# Patient Record
Sex: Male | Born: 1945 | Race: White | Hispanic: No | Marital: Married | Smoking: Former smoker
Health system: Southern US, Community
[De-identification: ages and names within clinical notes are randomized; demographics above are authoritative.]

## PROBLEM LIST (undated history)

## (undated) DIAGNOSIS — I509 Heart failure, unspecified: Secondary | ICD-10-CM

## (undated) DIAGNOSIS — R0600 Dyspnea, unspecified: Secondary | ICD-10-CM

## (undated) DIAGNOSIS — I639 Cerebral infarction, unspecified: Secondary | ICD-10-CM

## (undated) DIAGNOSIS — R0601 Orthopnea: Secondary | ICD-10-CM

## (undated) DIAGNOSIS — I2510 Atherosclerotic heart disease of native coronary artery without angina pectoris: Secondary | ICD-10-CM

## (undated) DIAGNOSIS — R05 Cough: Secondary | ICD-10-CM

## (undated) DIAGNOSIS — J302 Other seasonal allergic rhinitis: Secondary | ICD-10-CM

## (undated) DIAGNOSIS — C439 Malignant melanoma of skin, unspecified: Secondary | ICD-10-CM

## (undated) DIAGNOSIS — J449 Chronic obstructive pulmonary disease, unspecified: Secondary | ICD-10-CM

## (undated) DIAGNOSIS — E7800 Pure hypercholesterolemia, unspecified: Secondary | ICD-10-CM

## (undated) DIAGNOSIS — R053 Chronic cough: Secondary | ICD-10-CM

## (undated) DIAGNOSIS — I10 Essential (primary) hypertension: Secondary | ICD-10-CM

## (undated) DIAGNOSIS — J45909 Unspecified asthma, uncomplicated: Secondary | ICD-10-CM

## (undated) HISTORY — PX: CORONARY ARTERY BYPASS GRAFT: SHX141

## (undated) HISTORY — PX: UVULECTOMY: SHX2631

## (undated) HISTORY — PX: NASAL SINUS SURGERY: SHX719

## (undated) HISTORY — PX: CARDIAC SURGERY: SHX584

---

## 2005-04-12 ENCOUNTER — Inpatient Hospital Stay (HOSPITAL_COMMUNITY): Admission: EM | Admit: 2005-04-12 | Discharge: 2005-04-14 | Payer: Self-pay | Admitting: Podiatry

## 2005-04-12 ENCOUNTER — Emergency Department: Payer: Self-pay | Admitting: Emergency Medicine

## 2005-05-23 ENCOUNTER — Encounter: Admission: RE | Admit: 2005-05-23 | Discharge: 2005-05-23 | Payer: Self-pay | Admitting: Neurology

## 2005-08-16 ENCOUNTER — Other Ambulatory Visit: Payer: Self-pay

## 2005-08-16 ENCOUNTER — Inpatient Hospital Stay: Payer: Self-pay | Admitting: Internal Medicine

## 2005-08-17 ENCOUNTER — Other Ambulatory Visit: Payer: Self-pay

## 2005-08-18 ENCOUNTER — Other Ambulatory Visit: Payer: Self-pay

## 2005-08-20 ENCOUNTER — Inpatient Hospital Stay (HOSPITAL_COMMUNITY)
Admission: AD | Admit: 2005-08-20 | Discharge: 2005-08-25 | Payer: Self-pay | Admitting: Thoracic Surgery (Cardiothoracic Vascular Surgery)

## 2005-08-31 ENCOUNTER — Other Ambulatory Visit: Payer: Self-pay

## 2005-08-31 ENCOUNTER — Emergency Department: Payer: Self-pay | Admitting: Emergency Medicine

## 2005-09-19 ENCOUNTER — Encounter
Admission: RE | Admit: 2005-09-19 | Discharge: 2005-09-19 | Payer: Self-pay | Admitting: Thoracic Surgery (Cardiothoracic Vascular Surgery)

## 2005-10-03 ENCOUNTER — Encounter
Admission: RE | Admit: 2005-10-03 | Discharge: 2005-10-03 | Payer: Self-pay | Admitting: Thoracic Surgery (Cardiothoracic Vascular Surgery)

## 2005-10-16 ENCOUNTER — Encounter: Payer: Self-pay | Admitting: Internal Medicine

## 2005-10-30 ENCOUNTER — Encounter: Payer: Self-pay | Admitting: Internal Medicine

## 2005-11-01 ENCOUNTER — Ambulatory Visit: Payer: Self-pay | Admitting: Otolaryngology

## 2005-11-30 ENCOUNTER — Encounter: Payer: Self-pay | Admitting: Internal Medicine

## 2005-12-30 ENCOUNTER — Encounter: Payer: Self-pay | Admitting: Internal Medicine

## 2006-09-21 IMAGING — CT CT MAXILLOFACIAL WITHOUT CONTRAST
2 of 4 series · 15 of 30 positions shown, 19 images · non-contrast
Comparison: none

REASON FOR EXAM: post nasal drip chronic sinusitis  Tilegenov
COMMENTS:

[Series 4: (person_name) series (id) · axial · 0.33mm/px · z∈[+397,+534]mm · 13 of 161 slices shown, 17 images]
[im 12/161  brain]
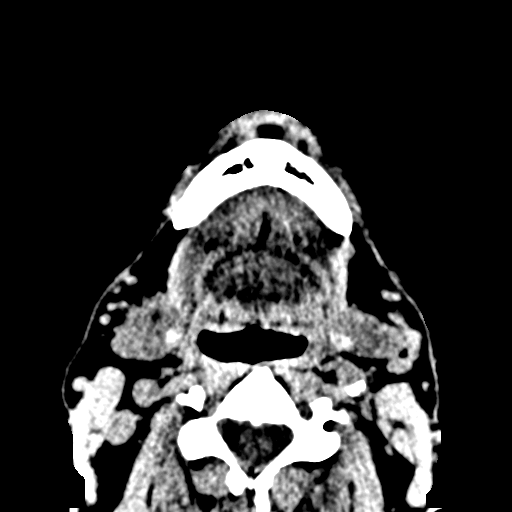
[im 12/161  bone]
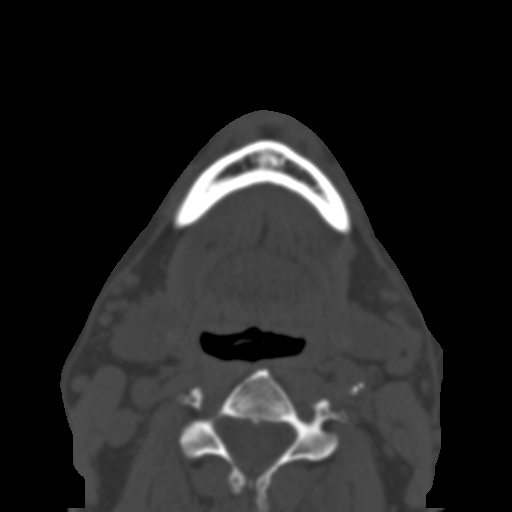
[im 23/161  bone]
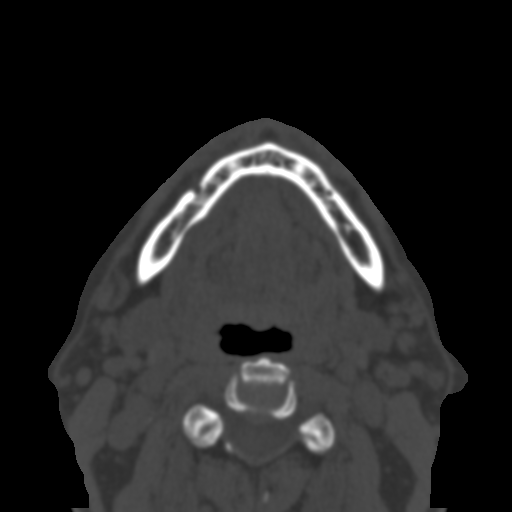
[im 35/161  bone]
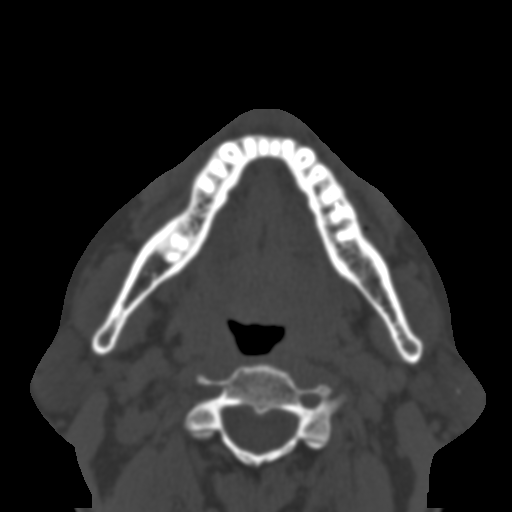
[im 46/161  bone]
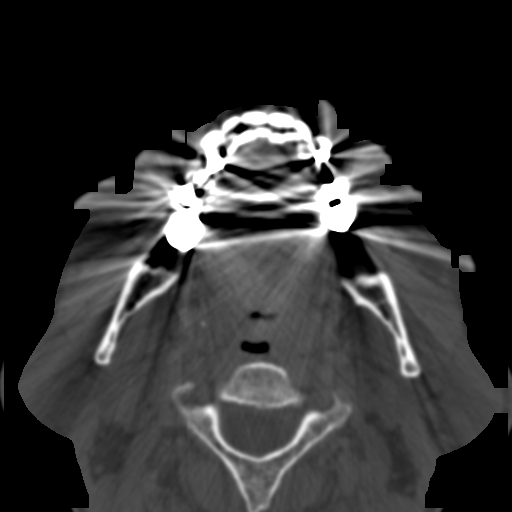
[im 58/161  brain]
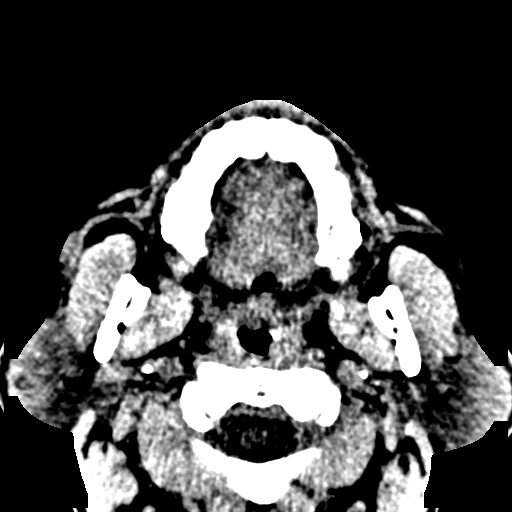
[im 58/161  bone]
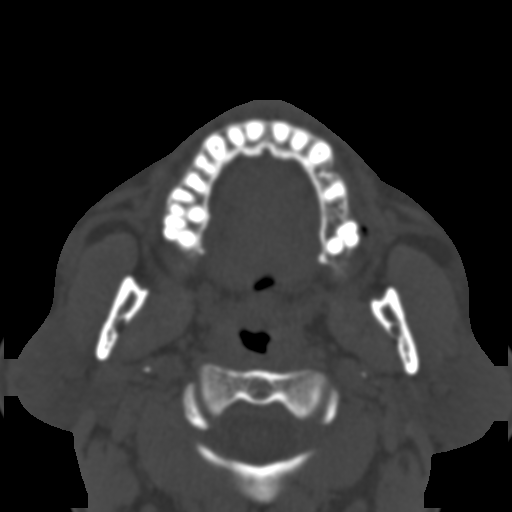
[im 69/161  bone]
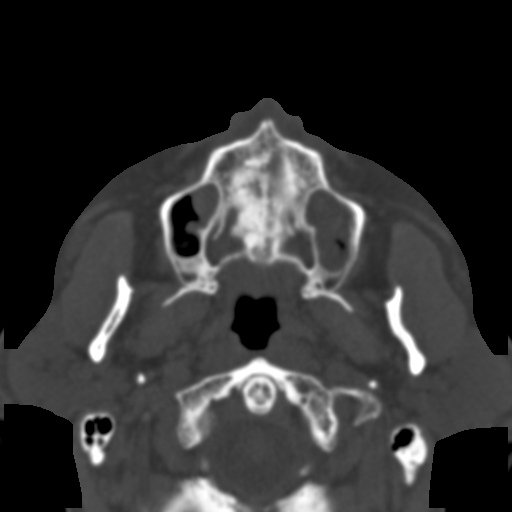
[im 81/161  bone]
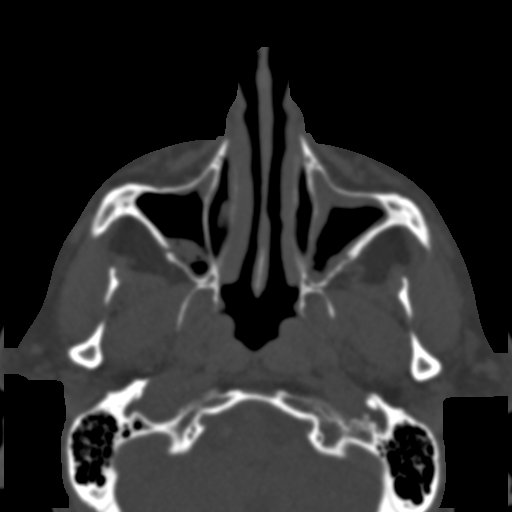
[im 92/161  bone]
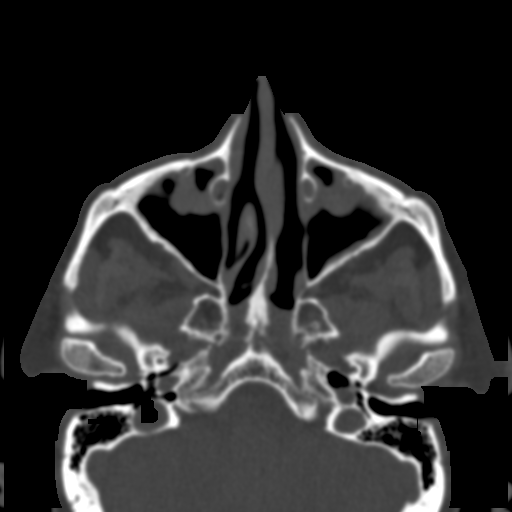
[im 103/161  brain]
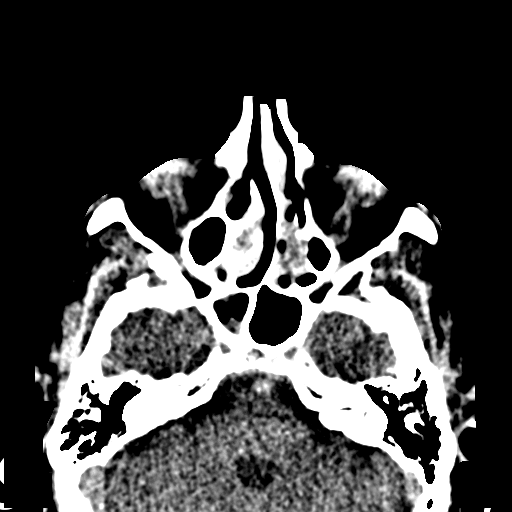
[im 103/161  bone]
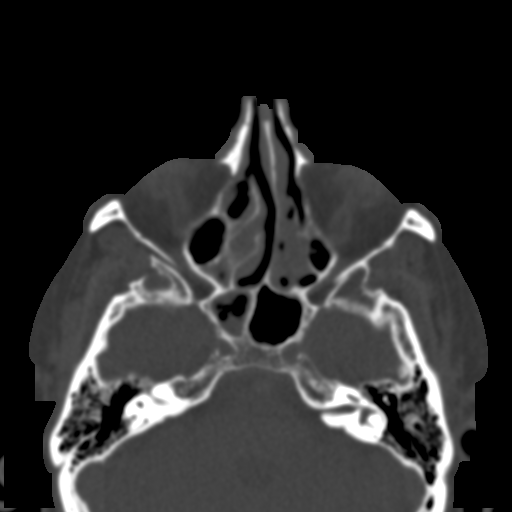
[im 115/161  bone]
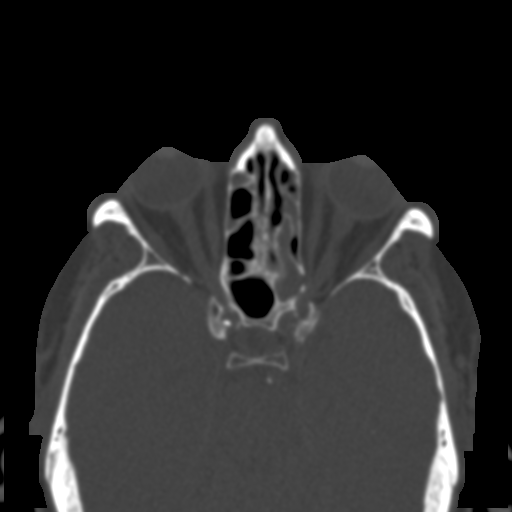
[im 126/161  bone]
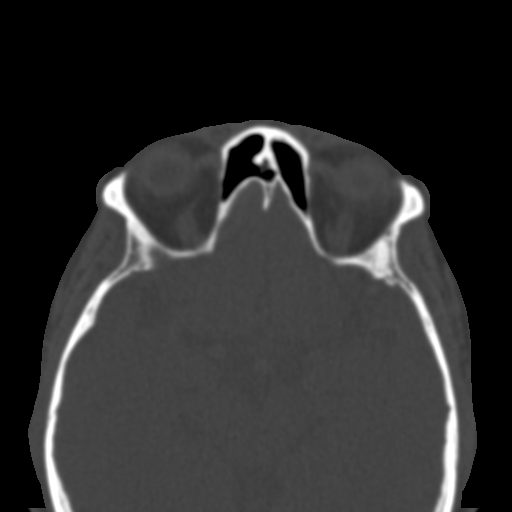
[im 138/161  bone]
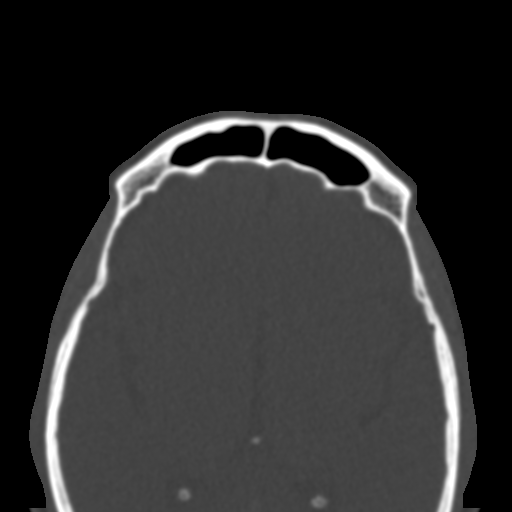
[im 149/161  brain]
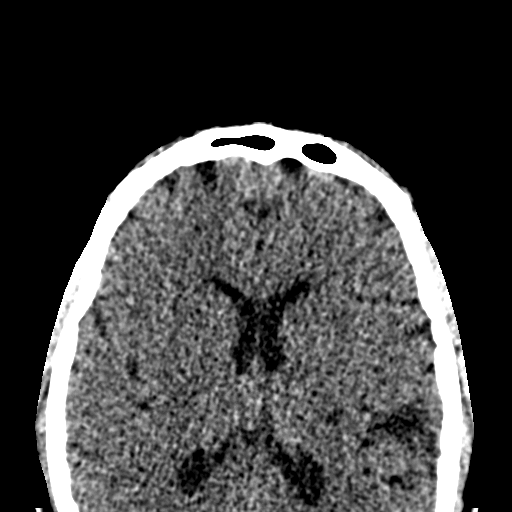
[im 149/161  bone]
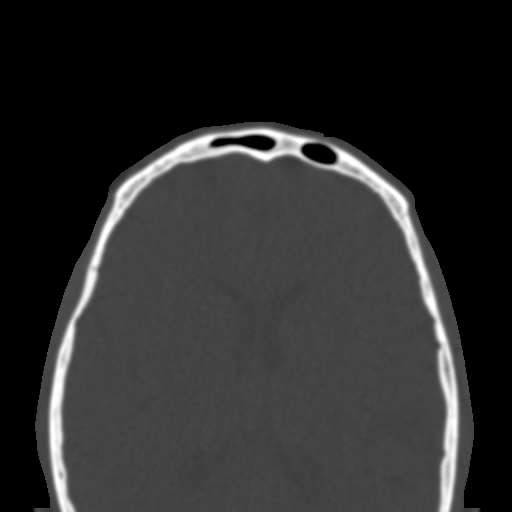

[Series 5: facial coronal · axial · 0.35mm/px · z∈[+428,+467]mm · 2 of 54 slices shown]
[im 14/54  bone]
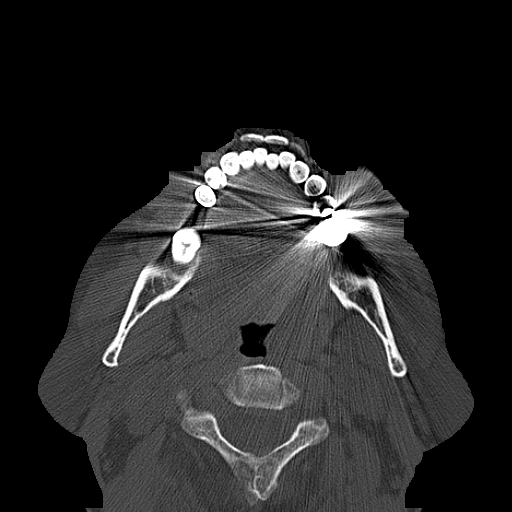
[im 27/54  bone]
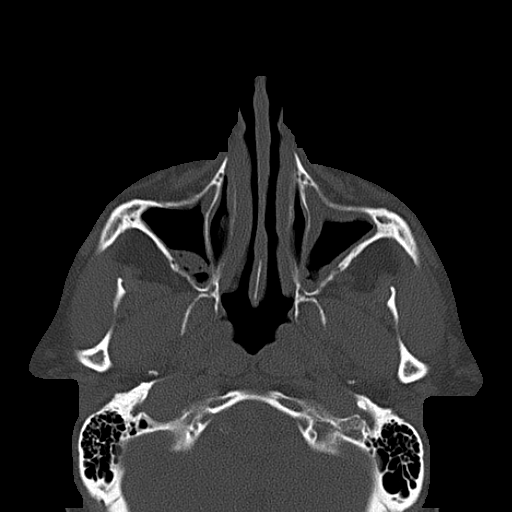

[15 of 30 positions shown; findings below may reference images not displayed]

PROCEDURE:     CT  - CT MAXILLOFACIAL ([facility])  - November 01, 2005  [DATE]

RESULT:     Supine Tilegenov series imaging through the paranasal sinuses is
performed.  The study shows slight curvature of the nasal septum to the
LEFT.  There is significant mucosal thickening predominantly in the
maxillary and ethmoid sinuses, but with some involvement in the RIGHT
sphenoid.  There is no evidence of bony destruction or significant
hypertrophy.  The findings can be consistent with extensive polyposis and/or
chronic sinusitis. No discrete air fluid levels are seen. The frontal
sinuses appear to be clear. It appears there may be changes of previous
surgery along the medial aspect of the maxillary sinuses bilaterally.  No
coronal reconstructions were performed. The mastoid air cells appear
normally aerated.
IMPRESSION: 1)See above.

## 2010-08-17 NOTE — Op Note (Signed)
NAMEPOWELL, HALBERT              ACCOUNT NO.:  [id number]   MEDICAL RECORD NO.:  [id number]          PATIENT TYPE:  INP   LOCATION:  2311                         FACILITY:  [facility]   PHYSICIAN:  Salvatore Decent. 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   OPERATION PERFORMED:  Median sternotomy, extracorporeal circulation,  coronary artery bypass grafting times five (left internal mammary artery to  left anterior descending, saphenous vein graft to first diagonal, saphenous  vein graft to posterior descending, sequential radial artery graft to ramus  intermedius and obtuse marginal 1), endoscopic vein harvest from right  thigh.   SURGEON:  Salvatore Decent. Dorris Fetch, M.D.   ASSISTANT:  Coral Ceo, P.A.   ANESTHESIA:  General.   FINDINGS:  All targets intramyocardial, relatively small for patient's size  but good quality conduits.  Normal left ventricular size and function.   INDICATIONS FOR PROCEDURE:  Mr. Zachary [name] is a 65 year old gentleman who has  a three to four week history of chest pain with exertion.  Over the past two  weeks it has worsened in frequency and severity and he has also had episodes  at rest.  He was admitted with unstable angina.  He ruled out for myocardial  infarction but at catheterization he had severe three-vessel coronary  disease and was referred for coronary artery bypass grafting.  The  indications, risks, benefits and alternatives were discussed in detail with  the patient and he understood and accepted the risks and agreed to proceed.  We did discuss potential use of the radial artery, given his young age.  The  patient understood the separate risks and benefits of that portion  of the  procedure as well and agreed to proceed.   DESCRIPTION OF PROCEDURE:  Mr. Pfiffner was brought to the preop holding  area on Aug 21, 2005.  There intravenous antibiotics were administered.  The  anesthesia service placed lines to monitor arterial, central venous and  pulmonary arterial pressure.  The patient was taken to the operating room,  anesthetized and intubated.  A Foley catheter was placed.  The chest,  abdomen, legs and the left arm were prepped and draped in the usual fashion.   Incision was made over the medial aspect of the right leg at the level of  the knee.  The greater saphenous vein was identified and was harvested from  the right thigh endoscopically.  It was an excellent quality vessel.  Simultaneously an incision was made over the volar aspect of the right  forearm overlying the radial pulse.  It was carried through the skin and  subcutaneous tissue.  The sheath overlying the radial artery was incised.  A  short segment of the radial artery was mobilized using the Harmonic scalpel  with proximal occlusion.  There was no change in the pulse of the vessel  distally.  This  confirmed the results of the preoperative Allen's test done  both the night before and in the preop holding.  The incision then was  extended to just below the antecubital fossa and the radial artery was  harvested using the Harmonic scalpel.  It was a good quality vessel.  5000  units of heparin was administered during the harvest of the conduits.  The  radial artery was divided distally.  There was excellent backbleeding from  the distal stump.  This was suture ligated with a 4-0 Prolene suture.  After  ensuring that all side branches had been clipped, this was divided  proximally as well and the proximal stump was also suture ligated.  The arm  and leg incisions were closed in standard fashion.   The median sternotomy was performed.  The left internal mammary artery was  harvested using  standard technique.  It was likewise a good quality vessel.  Remainder of the full heparin dose was given prior to dividing the distal  end of the left mammary artery.   The pericardium was opened.  The ascending aorta was inspected.  There was  no evidence of atherosclerotic disease.  The aorta was cannulated via  concentric 2-0 Ethibond pledgeted pursestring sutures.  A dual stage venous  cannula was placed via pursestring suture in the right atrial appendage.  Cardiopulmonary bypass was instituted and the patient was cooled to 32  degrees Celsius.  The coronary arteries were inspected and anastomotic sites  were chosen.  The conduits were inspected and cut to length.  A foam pad was  placed in the pericardium to protect the left phrenic nerve.  A temperature  probe was placed in the myocardial septum and a cardioplegia cannula was  placed in the ascending aorta.   The aorta was crossclamped.  The left ventricle was emptied via the aortic  root vent.  Cardiac arrest then was achieved with a combination of cold  antegrade blood cardioplegia and topical iced saline.  After achieving a  complete diastolic arrest and adequate myocardial septal cooling, the  following distal anastomoses were performed.   First, a reversed saphenous vein graft was placed end-to-side to the first  diagonal branch of the LAD.  This was a 1.5 mm intramyocardial good quality  target.  The vein was good quality, it was anastomosed end-to-side with a  running 7-0 Prolene suture.  At the completion of each anastomosis probes  were placed proximally and distally to ensure patency.  Cardioplegia was  administered down the vein graft.  There was good hemostasis and good flow.   Next a reversed saphenous vein graft was placed end-to-side to the posterior  descending branch of the right coronary artery.  The right coronary artery was totally occluded.  The posterior descending was a 1.3 mm fair quality  target.  The  vein was of good quality.  It was anastomosed end-to-side with  a running 7-0 Prolene suture.  The distal posterolateral branches of the  right coronary were too small to grasp.   Next, the left radial artery was placed sequentially to the ramus  intermedius and obtuse marginal 1 branch of the left circumflex.  These were  both 1.5 mm vessels and both intramyocardial.  Side-to-side anastomosis was  performed to the ramus intermedius and end-to-side to obtuse marginal 1.  Both were done with running 8-0 Prolene sutures.  There was excellent flow  through the graft.  Cardioplegia was administered down the vein grafts and  via  the aortic root.  There was good back bleeding from the radial artery.   Next, the left internal mammary artery was brought through a window in the  pericardium.  The distal end was spatulated.  It was anastomosed end-to-side  to the LAD.  The LAD was a 2 mm intramyocardial vessel.  The vessel did have  some plaque distal to the anastomosis but a probe did pass easily to the  apex. The mammary was of good quality.  The anastomosis was performed with a  running 8-0 Prolene suture.  At the completion of the mammary to LAD  anastomosis, the bulldog clamp was briefly removed and inspected for  hemostasis.  Immediate and rapid septal rewarming was noted.  The bulldog  clamps were placed.  The mammary pedicle was clamped to the epicardial  surface of the heart with 6-0 Prolene sutures.   Additional cardioplegia then was administered.  The radial artery was of  good size and adequate size and adequate size to anastomose directly to a  4.0 mm punch aortotomy.  The vein grafts were anastomosed to 4.5 mm punch  aortotomies.  At completion of the final proximal anastomosis, the patient  was placed in Trendelenburg position.  Lidocaine was administered.  The  bulldog clamp was again removed from the left mammary artery.  The aortic  root was deaired and the aortic crossclamp was  removed.  The total  crossclamp time was 89 minutes.   While the patient was being rewarmed, all proximal and distal anastomoses  were inspected for hemostasis.  Epicardial pacing wires were placed on the  right ventricle and right atrium and when the patient had been rewarmed to a  core temperature of 37 degrees Celsius, he was weaned from cardiopulmonary  bypass without difficulty in sinus rhythm and on no inotropic support.  The  total bypass time was 132 minutes.   The initial cardiac index was greater than 2L per minute per minute squared  and the patient remained hemodynamically stable throughout the post bypass  period.  The test dose of protamine was administered and was well tolerated.  The atrial and aortic cannulae were removed.  The remainder of the protamine  was administered without incident.  The chest was irrigated with 1L of warm normal saline containing 1 gm of vancomycin.  Hemostasis was achieved.  A  left pleural and two mediastinal chest tubes were placed through separate  subcostal incisions.  The pericardium was reapproximated with interrupted 3-  0 silk sutures.  It came together easily without tension.  The sternum was  closed with heavy gauge interrupted stainless steel wires.  The subcutaneous  tissue and pectoralis fascia was closed in standard fashion.  All sponge,  needle and instrument counts were correct at the end of the procedure.  The  patient was taken from the operating room to the surgical intensive care  unit in critical but stable condition.           ______________________________  Salvatore Decent Dorris Fetch, M.D.     SCH/MEDQ  D:  08/21/2005  T:  08/22/2005  Job:  [id number]   cc:   Dr. Arnoldo Hooker   Dr. Lind Guest   Dr. Annetta Maw, M.D.  Fax: [phone]

## 2010-08-17 NOTE — H&P (Signed)
NAMEADVITH, MARTINE              ACCOUNT NO.:  [id number]   MEDICAL RECORD NO.:  [id number]          PATIENT TYPE:  INP   LOCATION:  2005                         FACILITY:  [facility]   PHYSICIAN:  Salvatore Decent. Dorris Fetch, M.D.DATE OF BIRTH:  1945/11/04   DATE OF ADMISSION:  08/20/2005  DATE OF DISCHARGE:                                HISTORY & PHYSICAL   CHIEF COMPLAINT:  Chest pain.   HISTORY OF PRESENT ILLNESS:  The patient is a 65 year old white male who was  admitted in transfer today from [facility] with a  diagnosis of severe three-vessel coronary artery disease with unstable  angina.  The patient had complained of a 3- to 4-week history of chest pain  which occurred with exertion and was relieved with rest.  He states that  over the past 2 weeks his symptoms have worsened in frequency and severity.  He has had some associated shortness of breath but had thought this was more  related to his history of sinus trouble.  He also has experienced one  episode of orthopnea but denies any nausea, vomiting, diaphoresis or  shortness of breath associated with the chest pain episodes.  He was seen by  his primary care physician and was undergoing a workup initially for  pulmonary problems including pulmonary function tests and allergy studies;  however, he ultimately underwent outpatient stress echocardiogram.  During  that study he was only able to walk for approximately 5 minutes before  becoming symptomatic.  Because of this he was originally scheduled for  outpatient cardiac catheterization today with Dr. Gwen Pounds; however, a  significant episode of chest pain at rest prompted him to present to the  emergency department at [facility] on Friday Aug 16, 2005, for further  evaluation.  This was the first episode he has had at rest and was  significantly worse in intensity than any of his previous episodes.  He is  admitted for further evaluation and was subsequently  ruled out for  myocardial infarction.  He underwent cardiac catheterization on Aug 19, 2005, and was found to have severe three-vessel coronary artery disease.  He  was found to have a 70% stenosis of the distal left main, had 90% proximal  LAD stenosis, an 80% stenosis of the first diagonal, an 80% stenosis of the  first obtuse marginal, and 100% stenosis of proximal right coronary artery,  and left ventricular ejection fraction of around 60%.  He has had several  episodes of chest pain while in the hospital and he feels that all of those  have been related to getting out of bed and ambulating in the room, as well  as removing his supplemental oxygen.  These episodes have been quickly  relieved with rest and restart of his O2, and he has been stable over the  past 24 hours.  Because of his severe three-vessel disease and his unstable  symptoms, he was transferred to [facility] today for a  cardiothoracic surgery consultation and consideration of coronary artery  bypass grafting by Dr. Dorris Fetch.   Of note, the patient has also  experienced pain occurring in his back  intermittently for the past 3-4 weeks with minimal activity, and on the date  of his admission to [facility] he had a significant episode of pain in his  back as well as pain in his chest at rest.   PAST MEDICAL HISTORY:  1.  Hypertension.  2.  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  3.  Malignant melanoma.  4.  Hyperlipidemia.  5.  Sleep apnea (patient has CPAP at home but does not use it).  6.  History of chronic sinusitis.   PAST SURGICAL HISTORY:  1.  Excision of melanoma on his back around 20 years ago.  2.  Sinus surgery.   MEDICATIONS ON ADMISSION:  1.  Aspirin 81 mg daily.  2.  Nasonex two sprays daily.  3.  Nitroglycerin 2% ointment q.8h.  4.  Lopressor 25 mg q.8h.   HOME MEDICATIONS:  1.  Lisinopril 5 mg daily.  2.  Metoprolol ER 25 mg daily.  3.  Nasonex two puffs  daily.   ALLERGIES:  He reports allergies to HYDROCHLOROTHIAZIDE which caused itching  and a rash; also to LIPITOR which caused a rash.   SOCIAL HISTORY:  He is married and has two children.  He resides with his  wife in [place].  He previously smoked a pack of cigarettes per day for  approximately 35 years.  He quit smoking 8 years ago.  He does not consume  alcohol.  He is currently employed as a Engineer, water.   FAMILY HISTORY:  His mother died of pancreatic cancer.  His father has a  history of coronary artery disease and underwent a CABG 2 years ago.  He  also has hypertension and has had a CVA in the past.  Two of his sisters  have hypertension and he has one other sister who is alive and well with no  chronic medical problems.   REVIEW OF SYSTEMS:  See history of present illness for pertinent positives  and negatives.  Additionally, he is status post a hemorrhagic right-sided  CVA in January 2007 for which he was treated [facility].  According  to his wife, he continues to have some right-sided arm and leg weakness,  although the patient has not noted any particular problems in this area.  Otherwise, he is recovered well from his stroke.  He has a history of  chronic sinus problems with productive cough most days and postnasal  drainage.  He wears glasses.  He has history of sleep apnea as documented  above but does not use CPAP and is not currently being treated otherwise.  He denies fevers, chills, weight loss, recent infections, neurologic  symptoms, visual changes, dysphagia and dysarthria, syncope, presyncope,  heart palpitations, abdominal pain, nausea, vomiting, diarrhea,  constipation, reflux symptoms, hematochezia, melena, hematuria, dysuria,  nocturia, lower extremity edema, lower extremity claudication symptoms or  rest pain, muscle and joint pain or weakness, anxiety, depression or other  psychiatric illnesses.  PHYSICAL EXAMINATION:  VITAL SIGNS:  Blood  pressure is 120/70, heart rate 74  and regular, respirations 18 and unlabored, temperature 97.3, O2 saturation  98% on 2 L.  GENERAL:  This is a well-developed, well-nourished white male in no acute  distress.  HEENT:  Normocephalic, atraumatic.  Pupils equal, round and react to light  accommodation.  Extraocular movements intact.  Exam of the external ears and  nose reveal no abnormalities.  Oropharynx is clear with moist mucous  membranes  and good dentition.  NECK:  Supple without lymphadenopathy, thyromegaly or carotid bruits.  HEART:  Regular rate and rhythm without murmurs, rubs or gallops.  LUNGS:  Clear to auscultation.  ABDOMEN:  Soft, nontender, nondistended with active bowel sounds in all  quadrants.  No masses or hepatosplenomegaly.  EXTREMITIES:  No clubbing, cyanosis or edema.  He has a StarClose device in  the right groin and no evidence of hematoma.  He has 2+ femoral and  posterior tibial pulses and 1+ dorsalis pedis pulses bilaterally.  NEUROLOGIC:  Cranial nerves II-XII appear grossly intact.  He is alert and  oriented x3.  Gait is unable to be tested.   ASSESSMENT AND PLAN:  This is a 65 year old white male with severe three-  vessel coronary artery disease and unstable angina symptoms.  He is being  admitted to [facility] today under the care of Dr. Charlett Lango for consideration of surgical revascularization.  He will  undergo complete preoperative workup this evening and potentially could  undergo CABG on Aug 21, 2005.      Coral Ceo, P.A.    ______________________________  Salvatore Decent Dorris Fetch, M.D.    GC/MEDQ  D:  08/20/2005  T:  08/20/2005  Job:  [id number]   cc:   Marcial Pacas M.D. [facility]  [place], [place]   Dorothyann Peng, M.D.   Marlan Palau, M.D.  Fax: [phone]

## 2010-08-17 NOTE — Discharge Summary (Signed)
Zachary [name], Zachary [name]              ACCOUNT NO.:  [id number]   MEDICAL RECORD NO.:  [id number]          PATIENT TYPE:  INP   LOCATION:  2016                         FACILITY:  [facility]   PHYSICIAN:  Salvatore Decent. Dorris Fetch, M.D.DATE OF BIRTH:  12-26-1945   DATE OF ADMISSION:  08/20/2005  DATE OF DISCHARGE:  08/25/2005                                 DISCHARGE SUMMARY   ADMISSION DIAGNOSIS:  Chest pain.   HISTORY OF PRESENT ILLNESS AND DISCHARGE DIAGNOSES:  1.  Hypertension.  2.  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  3.  Malignant melanoma.  4.  Hyperlipidemia.  5.  Sleep apnea.  6.  History of chronic sinusitis.  7.  Excision of melanoma in his back approximately 20 years ago.  8.  Sinus surgery.  9.  Severe three-vessel coronary artery disease status post coronary artery      bypass grafting x5.  10. Postoperative acute blood loss anemia, currently being treated with iron      and folic acid supplementation.   ALLERGIES:  1.  HYDROCHLOROTHIAZIDE causes itching and rash.  2.  LIPITOR causes rash.   BRIEF HISTORY:  The patient is a 65 year old Caucasian male who was admitted  via transfer to [facility] from [facility] on Aug 20, 2005,  with diagnosis of severe three-vessel coronary artery disease with unstable  angina.  The patient had experienced 3 to 4-week history of chest pain with  exertion, that was relieved with rest, that had progressed in frequency and  severity.  He was seen initially by his primary care physician and had been  referred for outpatient stress echocardiogram.  During that study, he was  only able to walk for approximately 5 minutes before becoming symptomatic.  Secondary to this, the patient was originally scheduled for an outpatient  cardiac catheterization on Aug 20, 2005, with Dr. Gwen Pounds; however, he  experienced a significant episode of chest pain at rest which prompted him  to present to the  ER at [facility] on Friday, May 18, for further  evaluation.  He was admitted and ruled out for myocardial infarction.  He  underwent cardiac catheterization on Aug 19, 2005, and was found to have  severe three-vessel coronary artery disease.  Secondary to these findings,  the patient was transferred to [facility] under the care of Dr.  Charlett Lango for consideration of coronary artery bypass grafting.   HOSPITAL COURSE:  The patient was admitted on Aug 20, 2005, via transfer as  previously stated.  Dr. Dorris Fetch evaluated the patient, and it was his  opinion the patient should proceed with coronary revascularization.  The  patient was taken to the OR on Aug 21, 2005, for coronary artery bypass  grafting x5.  The left internal mammary artery was grafted to the LAD;  saphenous vein was grafted to the diagonal; saphenous vein was grafted to  the right coronary artery; and the left renal artery was grafted in sequence  to the ramus and OM-1. Saphenous vein graft harvesting was performed on the  right thigh.  The patient tolerated the procedure well and was  hemodynamically stable immediately postoperatively.  The patient was  transferred from the OR to the SICU in stable condition.  The patient was  extubated without complication and woke up from anesthesia neurologically  intact.   Secondary to patient's history of intracranial bleed, neurology service was  asked to evaluate the patient in the early postoperative course.  It was his  opinion that there was on contraindication to antiplatelet agents such as  aspirin.  On postoperative day #1, the patient was afebrile with stable  vital signs and maintaining normal sinus rhythm.  His invasive lines and  chest tubes were discontinued in a routine manner, and he was started on  p.o. amiodarone for his radial artery graft.  He was also started on  aspiring.   The patient began cardiac rehab on postoperative day #1 and  has increased  his tolerance to a satisfactory level at this time.  The patient has been  volume overloaded postoperatively and has been diuresed accordingly.   On postoperative day #3, the patient is afebrile with stable vital signs and  maintaining normal sinus rhythm.  On physical exam, cardiac are regular rate  and rhythm.  Lungs reveal crackles in the left base.  The abdomen is benign.  The incisions are clean, dry, and intact, and there is no edema present in  the bilateral lower extremities.  He was noted to be anemic with a  hemoglobin of 9.3 and a hematocrit of 29.6.  He will, therefore, be started  on iron and folic acid supplementation.   The patient is in stable condition at this time.  As long as he continues to  progress in the current manner, he should be ready for discharge within the  next 1 to 2 days pending morning round evaluation.   LABORATORY DATA:  CBC and BMP on Aug 24, 2005: White count 13.2, hemoglobin  9.3, hematocrit 26.9, platelets 139.  Sodium 138, potassium 4.2, BUN 10,  creatinine 1, glucose 111.   CONDITION ON DISCHARGE:  Improved.   DISCHARGE INSTRUCTIONS:   #1. DISCHARGE MEDICATIONS:  1.  Aspirin 325 mg daily.  2.  Lopressor 25 mg twice daily.  3.  Lisinopril 5 mg daily.  4.  Isosorbide mononitrate 30 mg daily.  5.  Nasonex 2 sprays daily.  6.  Iron supplementation 325 mg daily.  7.  Folic acid 1 mg daily.  8.  Oxycodone 5 mg 1 to 2 q. 4-6 h p.r.n. pain.   ACTIVITY:  No driving and no lifting more than 10 pounds x3 weeks, and the  patient should continue daily breathing and walking exercises.   DIET:  Low salt, low fat.   WOUND CARE:  The patient may shower daily and clean the incisions with soap  and water.  If any  problems arise, the patient should contact the CVTS  office at [phone].   FOLLOW UP:  1.  Appointment with Dr. Gwen Pounds.  The patient will be instructed to     contact his office for an appointment 2 weeks after  discharge.  2.  With Dr. Dorris Fetch on September 19, 2005, at 11:15 a.m.      [name], PA    ______________________________  Salvatore Decent Dorris Fetch, M.D.    AY/MEDQ  D:  08/24/2005  T:  08/25/2005  Job:  [id number]   cc:   Dr. Gwen Pounds

## 2010-08-17 NOTE — Consult Note (Signed)
Zachary [name], Zachary [name] NO.:  [id number]   MEDICAL RECORD NO.:  [id number]          PATIENT TYPE:  INP   LOCATION:  2311                         FACILITY:  [facility]   PHYSICIAN:  Marlan Palau, M.D.  DATE OF BIRTH:  [birth year]   DATE OF CONSULTATION:  08/21/2005  DATE OF DISCHARGE:                                   CONSULTATION   Zachary [name] is a 65 year old right-handed white male born January 29, 1946 with a history of cerebrovascular disease.  Patient was admitted to  [facility] on the neurology service on the 12th of January 2007  with a hemorrhagic infarct involving the left centrum semi ovale.  Patient  underwent a work-up at that time that reveals evidence of right middle  cerebral artery stenosis and some bilateral mild cavernous carotid artery  atherosclerotic changes.  Otherwise, MRI angiogram of the intracranial and  extracranial vessels were unremarkable.  Patient had minimal deficit at that  time and recovered quite well.  Patient has done well and has been admitted  at this point with problems with severe three vessel coronary artery disease  and three to four-week history of unstable angina.  Patient underwent a CABG  procedure today.  Dr. Dorris Fetch has asked neurology to comment on whether  patient may be able to go back on antiplatelet agents at this time.   PAST MEDICAL HISTORY:  1.  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  2.  Hypertension.  3.  Hypercholesterolemia.  4.  Sinus surgery in the past.  5.  History of sleep apnea on CPAP; does not use the machine.  6.  Mild obesity.  7.  Malignant melanoma resected 20 years ago.   Patient had been on aspirin 81 mg, Nasonex two sprays a day, nitroglycerin  ointment q.8h., Lopressor 25 mg q.8h. prior to admission.  Patient has an  allergy to HYDROCHLOROTHIAZIDE, LIPITOR.  Patient does not smoke or drink.   SOCIAL HISTORY:  [place].  Has two children alive and well.  Lives with his wife.  Works as an  Art gallery manager.   FAMILY HISTORY:  Notable that mother died with cancer.  Father is alive with  hypertension, stroke.  Patient has three sisters, one with hypertension.  No  history of diabetes or heart disease in the family.   REVIEW OF SYSTEMS:  Unobtainable at this point.  Patient is intubated.   PHYSICAL EXAMINATION:  VITALS:  Blood pressure currently is 100/70, heart  rate 84, temperature afebrile, respiratory rate 12 per ventilator.  HEENT:  Head is atraumatic.  Pupils are 1-2 mm, trace reactive.  Disks are  not evaluated.  NECK:  Supple.  No carotid bruits noted.  RESPIRATORY:  Clear.  CARDIOVASCULAR:  Distant heart sounds.  No obvious murmurs, rubs noted.  EXTREMITIES:  Right leg is bandaged.  Left leg is not.  No significant edema  noted.  Patient will nod to questions, will follow commands, squeeze both  hands, wiggle toes.  An extensive cerebellar testing procedure and  neurologic examination otherwise  was not done.  Deep tendon reflexes are  symmetric.  Toes are neutral bilaterally.   LABORATORY VALUES:  Notable for white count of 20.6, hemoglobin of 11.9,  hematocrit of 34.8, MCV of 90.8, platelets of 134.  INR of 1.3.  Sodium 138,  potassium 4.7, glucose of 120, CO2 of 26, glucose of 114, BUN of 10,  creatinine 1.1, calcium 9.  Urinalysis revealed specific gravity 1.018, pH  of 6.5.   IMPRESSION:  1.  History of severe three vessel coronary artery disease status post      coronary artery bypass graft procedure.  2.  History of hemorrhagic infarct left centrum semi ovale in January 2007.   This patient at this point is well out from the hemorrhagic infarct and  clearly is a candidate for antiplatelet agents.  Should be no significant  increase in risk of recurrent hemorrhage using low-dose aspirin at this  point from a neurologic standpoint.  Will be happy to see this patient  at  any point again if needed.  Patient denies any further episodes of numbness,  weakness, speech disturbance.      Marlan Palau, M.D.  Electronically Signed     CKW/MEDQ  D:  08/21/2005  T:  08/22/2005  Job:  [id number]

## 2014-03-05 ENCOUNTER — Ambulatory Visit: Payer: Self-pay | Admitting: Internal Medicine

## 2015-05-05 ENCOUNTER — Emergency Department: Payer: Medicare Other

## 2015-05-05 ENCOUNTER — Encounter: Payer: Self-pay | Admitting: Emergency Medicine

## 2015-05-05 ENCOUNTER — Inpatient Hospital Stay
Admission: EM | Admit: 2015-05-05 | Discharge: 2015-05-07 | DRG: 190 | Disposition: A | Discharge status: Home / Self Care | Payer: Medicare Other | Attending: Internal Medicine | Admitting: Internal Medicine

## 2015-05-05 DIAGNOSIS — R000 Tachycardia, unspecified: Secondary | ICD-10-CM | POA: Diagnosis present

## 2015-05-05 DIAGNOSIS — R739 Hyperglycemia, unspecified: Secondary | ICD-10-CM | POA: Diagnosis present

## 2015-05-05 DIAGNOSIS — Z7982 Long term (current) use of aspirin: Secondary | ICD-10-CM

## 2015-05-05 DIAGNOSIS — Z800 Family history of malignant neoplasm of digestive organs: Secondary | ICD-10-CM

## 2015-05-05 DIAGNOSIS — Z951 Presence of aortocoronary bypass graft: Secondary | ICD-10-CM

## 2015-05-05 DIAGNOSIS — J9601 Acute respiratory failure with hypoxia: Secondary | ICD-10-CM | POA: Diagnosis present

## 2015-05-05 DIAGNOSIS — Z8249 Family history of ischemic heart disease and other diseases of the circulatory system: Secondary | ICD-10-CM

## 2015-05-05 DIAGNOSIS — R0902 Hypoxemia: Secondary | ICD-10-CM

## 2015-05-05 DIAGNOSIS — I2510 Atherosclerotic heart disease of native coronary artery without angina pectoris: Secondary | ICD-10-CM | POA: Diagnosis present

## 2015-05-05 DIAGNOSIS — Z87891 Personal history of nicotine dependence: Secondary | ICD-10-CM | POA: Diagnosis not present

## 2015-05-05 DIAGNOSIS — J441 Chronic obstructive pulmonary disease with (acute) exacerbation: Secondary | ICD-10-CM | POA: Diagnosis not present

## 2015-05-05 DIAGNOSIS — E785 Hyperlipidemia, unspecified: Secondary | ICD-10-CM | POA: Diagnosis present

## 2015-05-05 DIAGNOSIS — D72829 Elevated white blood cell count, unspecified: Secondary | ICD-10-CM | POA: Diagnosis present

## 2015-05-05 DIAGNOSIS — I10 Essential (primary) hypertension: Secondary | ICD-10-CM | POA: Diagnosis present

## 2015-05-05 DIAGNOSIS — J209 Acute bronchitis, unspecified: Secondary | ICD-10-CM | POA: Diagnosis present

## 2015-05-05 DIAGNOSIS — Z8673 Personal history of transient ischemic attack (TIA), and cerebral infarction without residual deficits: Secondary | ICD-10-CM | POA: Diagnosis not present

## 2015-05-05 DIAGNOSIS — R079 Chest pain, unspecified: Secondary | ICD-10-CM

## 2015-05-05 HISTORY — DX: Heart failure, unspecified: I50.9

## 2015-05-05 HISTORY — DX: Essential (primary) hypertension: I10

## 2015-05-05 HISTORY — DX: Atherosclerotic heart disease of native coronary artery without angina pectoris: I25.10

## 2015-05-05 HISTORY — DX: Cerebral infarction, unspecified: I63.9

## 2015-05-05 HISTORY — DX: Pure hypercholesterolemia, unspecified: E78.00

## 2015-05-05 HISTORY — DX: Chronic obstructive pulmonary disease, unspecified: J44.9

## 2015-05-05 LAB — BASIC METABOLIC PANEL
ANION GAP: 13 (ref 5–15)
BUN: 14 mg/dL (ref 6–20)
CALCIUM: 9.4 mg/dL (ref 8.9–10.3)
CHLORIDE: 103 mmol/L (ref 101–111)
CO2: 21 mmol/L — ABNORMAL LOW (ref 22–32)
CREATININE: 1.05 mg/dL (ref 0.61–1.24)
GFR calc non Af Amer: >60 mL/min (ref >60)
Glucose, Bld: 161 mg/dL — ABNORMAL HIGH (ref 65–99)
Potassium: 3.8 mmol/L (ref 3.5–5.1)
SODIUM: 137 mmol/L (ref 135–145)

## 2015-05-05 LAB — CBC
HCT: 48.4 % (ref 40.0–52.0)
Hemoglobin: 16.4 g/dL (ref 13.0–18.0)
MCH: 30.8 pg (ref 26.0–34.0)
MCHC: 33.8 g/dL (ref 32.0–36.0)
MCV: 90.9 fL (ref 80.0–100.0)
PLATELETS: 175 10*3/uL (ref 150–440)
RBC: 5.32 MIL/uL (ref 4.40–5.90)
RDW: 13.7 % (ref 11.5–14.5)
WBC: 14.2 10*3/uL — AB (ref 3.8–10.6)

## 2015-05-05 LAB — TROPONIN I

## 2015-05-05 LAB — MAGNESIUM: Magnesium: 1.7 mg/dL (ref 1.7–2.4)

## 2015-05-05 MED ORDER — METOPROLOL TARTRATE 50 MG PO TABS
50.0000 mg | ORAL_TABLET | Freq: Two times a day (BID) | ORAL | Status: DC
  Administered 2015-05-05 – 2015-05-07 (×4): 50 mg via ORAL
  Filled 2015-05-05 (×3): qty 1

## 2015-05-05 MED ORDER — HEPARIN SODIUM (PORCINE) 5000 UNIT/ML IJ SOLN
5000.0000 [IU] | Freq: Three times a day (TID) | INTRAMUSCULAR | Status: DC
  Administered 2015-05-05 – 2015-05-07 (×5): 5000 [IU] via SUBCUTANEOUS
  Filled 2015-05-05 (×5): qty 1

## 2015-05-05 MED ORDER — ISOSORBIDE MONONITRATE ER 30 MG PO TB24
30.0000 mg | ORAL_TABLET | Freq: Every day | ORAL | Status: DC
  Administered 2015-05-05 – 2015-05-06 (×2): 30 mg via ORAL
  Filled 2015-05-05 (×2): qty 1

## 2015-05-05 MED ORDER — DEXTROMETHORPHAN POLISTIREX ER 30 MG/5ML PO SUER
30.0000 mg | Freq: Two times a day (BID) | ORAL | Status: DC
  Administered 2015-05-05 – 2015-05-07 (×4): 30 mg via ORAL
  Filled 2015-05-05 (×9): qty 5

## 2015-05-05 MED ORDER — IPRATROPIUM-ALBUTEROL 0.5-2.5 (3) MG/3ML IN SOLN
3.0000 mL | Freq: Once | RESPIRATORY_TRACT | Status: AC
  Administered 2015-05-05: 3 mL via RESPIRATORY_TRACT
  Filled 2015-05-05: qty 3

## 2015-05-05 MED ORDER — ONDANSETRON HCL 4 MG PO TABS: 4.0000 mg | ORAL_TABLET | Freq: Four times a day (QID) | ORAL | Status: DC | PRN

## 2015-05-05 MED ORDER — ALBUTEROL SULFATE (2.5 MG/3ML) 0.083% IN NEBU
2.5000 mg | INHALATION_SOLUTION | RESPIRATORY_TRACT | Status: DC | PRN
  Administered 2015-05-05 – 2015-05-06 (×2): 2.5 mg via RESPIRATORY_TRACT
  Filled 2015-05-05 (×3): qty 3

## 2015-05-05 MED ORDER — ZOLPIDEM TARTRATE 5 MG PO TABS
5.0000 mg | ORAL_TABLET | Freq: Every evening | ORAL | Status: DC | PRN
  Administered 2015-05-05 – 2015-05-06 (×2): 5 mg via ORAL
  Filled 2015-05-05 (×2): qty 1

## 2015-05-05 MED ORDER — METHYLPREDNISOLONE SODIUM SUCC 125 MG IJ SOLR
60.0000 mg | Freq: Four times a day (QID) | INTRAMUSCULAR | Status: DC
  Administered 2015-05-05 – 2015-05-07 (×7): 60 mg via INTRAVENOUS
  Filled 2015-05-05 (×7): qty 2

## 2015-05-05 MED ORDER — SODIUM CHLORIDE 0.9% FLUSH
3.0000 mL | Freq: Two times a day (BID) | INTRAVENOUS | Status: DC
  Administered 2015-05-05 – 2015-05-07 (×4): 3 mL via INTRAVENOUS

## 2015-05-05 MED ORDER — SODIUM CHLORIDE 0.9% FLUSH: 3.0000 mL | INTRAVENOUS | Status: DC | PRN

## 2015-05-05 MED ORDER — ONDANSETRON HCL 4 MG/2ML IJ SOLN: 4.0000 mg | Freq: Four times a day (QID) | INTRAMUSCULAR | Status: DC | PRN

## 2015-05-05 MED ORDER — DM-GUAIFENESIN ER 30-600 MG PO TB12
1.0000 | ORAL_TABLET | Freq: Two times a day (BID) | ORAL | Status: DC
  Filled 2015-05-05: qty 1

## 2015-05-05 MED ORDER — DEXTROSE 5 % IV SOLN
500.0000 mg | INTRAVENOUS | Status: DC
  Filled 2015-05-05: qty 500

## 2015-05-05 MED ORDER — DIPHENHYDRAMINE-APAP (SLEEP) 25-500 MG PO TABS: 2.0000 | ORAL_TABLET | Freq: Every day | ORAL | Status: DC

## 2015-05-05 MED ORDER — METOPROLOL TARTRATE 50 MG PO TABS
ORAL_TABLET | ORAL | Status: AC
  Administered 2015-05-05: 50 mg via ORAL
  Filled 2015-05-05: qty 1

## 2015-05-05 MED ORDER — BENZONATATE 100 MG PO CAPS: 100.0000 mg | ORAL_CAPSULE | Freq: Three times a day (TID) | ORAL | Status: DC | PRN

## 2015-05-05 MED ORDER — ROSUVASTATIN CALCIUM 5 MG PO TABS
2.5000 mg | ORAL_TABLET | Freq: Every day | ORAL | Status: DC
  Administered 2015-05-05 – 2015-05-06 (×2): 2.5 mg via ORAL
  Filled 2015-05-05 (×2): qty 1

## 2015-05-05 MED ORDER — SODIUM CHLORIDE 0.9 % IV BOLUS (SEPSIS)
1000.0000 mL | Freq: Once | INTRAVENOUS | Status: AC
  Administered 2015-05-05: 1000 mL via INTRAVENOUS

## 2015-05-05 MED ORDER — ASPIRIN EC 81 MG PO TBEC
81.0000 mg | DELAYED_RELEASE_TABLET | Freq: Every day | ORAL | Status: DC
  Administered 2015-05-06 – 2015-05-07 (×2): 81 mg via ORAL
  Filled 2015-05-05 (×3): qty 1

## 2015-05-05 MED ORDER — AZITHROMYCIN 500 MG PO TABS
500.0000 mg | ORAL_TABLET | Freq: Once | ORAL | Status: AC
  Administered 2015-05-05: 500 mg via ORAL
  Filled 2015-05-05: qty 1

## 2015-05-05 MED ORDER — GUAIFENESIN ER 600 MG PO TB12
600.0000 mg | ORAL_TABLET | Freq: Two times a day (BID) | ORAL | Status: DC
  Administered 2015-05-05 – 2015-05-07 (×4): 600 mg via ORAL
  Filled 2015-05-05 (×4): qty 1

## 2015-05-05 MED ORDER — ACETAMINOPHEN 650 MG RE SUPP: 650.0000 mg | Freq: Four times a day (QID) | RECTAL | Status: DC | PRN

## 2015-05-05 MED ORDER — SODIUM CHLORIDE 0.9 % IV SOLN: 250.0000 mL | INTRAVENOUS | Status: DC | PRN

## 2015-05-05 MED ORDER — DEXTROSE 5 % IV SOLN
1.0000 g | Freq: Once | INTRAVENOUS | Status: AC
  Administered 2015-05-05: 1 g via INTRAVENOUS
  Filled 2015-05-05: qty 10

## 2015-05-05 MED ORDER — ACETAMINOPHEN 325 MG PO TABS: 650.0000 mg | ORAL_TABLET | Freq: Four times a day (QID) | ORAL | Status: DC | PRN

## 2015-05-05 NOTE — ED Notes (Signed)
Pt having some shortness of breath, placed on 2L [place].

## 2015-05-05 NOTE — ED Provider Notes (Addendum)
[facility] Emergency Department Provider Note  Time seen: 3:58 PM  I have reviewed the triage vital signs and the nursing notes.   HISTORY  Chief Complaint Chest Pain    HPI Zachary [name] is a 70 y.o. male with a past medical history of CAD, COPD, hypertension, hyperlipidemia, CVA, CABG 2007, presents to the emergency department with chest pressure/tightness and shortness breath. According to the patient beginning last night he felt a dull pressure/tightness sensation to his central to left chest.Patient states he went for a walk last night which she believes started his shortness of breath and chest tightness. States a history of COPD. He went to his primary care doctor today and was prescribed prednisone and Augmentin. He took 50 mg of prednisone, 1 tablet of Augmentin this morning. States he continued to feel bad or shortness of breath so he called his cardiologist Dr.Paraschos who recommended he come to the emergency department for evaluation. Patient does not wear oxygen at home. Describes his chest pressure/tightness as mild to moderate, worse with exertion. Denies fever. Does have cough   Past Medical History  Diagnosis Date  . COPD (chronic obstructive pulmonary disease) ([place])   . Coronary artery disease   . High cholesterol   . Hypertension   . Stroke [facility])     There are no active problems to display for this patient.   Past Surgical History  Procedure Laterality Date  . Cardiac surgery      CABG x 5  2007    No current outpatient prescriptions on file.  Allergies Lipitor; Sulfa antibiotics; and Advair diskus  No family history on file.  Social History Social History  Substance Use Topics  . Smoking status: Former Research scientist (life sciences)  . Smokeless tobacco: Never Used  . Alcohol Use: No    Review of Systems Constitutional: Negative for fever. Cardiovascular: Mild chest discomfort/tightness. Respiratory: Positive shortness of  breath Gastrointestinal: Negative for abdominal pain Genitourinary: Negative for dysuria. Neurological: Negative for headache 10-point ROS otherwise negative.  ____________________________________________   PHYSICAL EXAM:  VITAL SIGNS: ED Triage Vitals  Enc Vitals Group     BP 05/05/15 1505 162/79 mmHg     Pulse Rate 05/05/15 1505 122     Resp 05/05/15 1505 20     Temp 05/05/15 1505 97.7 F (36.5 C)     Temp Source 05/05/15 1505 Oral     SpO2 05/05/15 1505 91 %     Weight 05/05/15 1505 216 lb (97.977 kg)     Height 05/05/15 1505 5\' 10"  (1.778 m)     Head Cir --      Peak Flow --      Pain Score 05/05/15 1507 4     Pain Loc --      Pain Edu? --      Excl. in [place]? --    Constitutional: Alert and oriented. Well appearing and in no distress. Eyes: Normal exam ENT   Head: Normocephalic and atraumatic.   Mouth/Throat: Mucous membranes are moist. Cardiovascular: Normal rate, regular rhythm. No murmur Respiratory: Mild tachypnea, diffuse expiratory wheeze, mild rhonchi bilaterally. Gastrointestinal: Soft and nontender. No distention.   Musculoskeletal: Nontender with normal range of motion in all extremities Neurologic:  Normal speech and language. No gross focal neurologic deficits Skin:  Skin is warm, dry and intact.  Psychiatric: Mood and affect are normal. Speech and behavior are normal.  ____________________________________________    EKG  EKG reviewed and interpreted by myself shows sinus tachycardia 126  bpm, narrow QRS, normal axis, normal intervals besides a prolonged QTC of 605 ms, Asian has nonspecific ST changes. No ST elevations.  ____________________________________________    RADIOLOGY  Chest x-ray shows no acute disease.   INITIAL IMPRESSION / ASSESSMENT AND PLAN / ED COURSE  Pertinent labs & imaging results that were available during my care of the patient were reviewed by me and considered in my medical decision making (see chart for  details).  Patient presents with symptoms most suggestive of COPD exacerbation. Patient did prednisone as well as Augmentin this morning, will dose DuoNeb's in the emergency department. Currently awaiting lab results including troponin. Patient is tachycardic to 125, tachypnea, with an oxygen saturation on room air of 89-90 percent.  Patient remains tachycardic 120-130 bpm greater in 30 minutes after DuoNeb's. Patient was tachycardic before DuoNeb's in 120's. Given the patient's significant wheeze on chest x-ray, low oxygen saturation we will admit to the hospital for COPD exacerbation, I have covered with antibiotics. Patient agreeable to plan. ____________________________________________   FINAL CLINICAL IMPRESSION(S) / ED DIAGNOSES  COPD exacerbation   Harvest Dark, MD 05/05/15 1821  Harvest Dark, MD 05/05/15 [phone]

## 2015-05-05 NOTE — ED Notes (Signed)
Pt was seen in [place] and started on atbx and prednisone taper. Also received neb tx.  Pt states he felt a little bit better after the treatment. Shortness of breath will resting in bed.  Oxygen saturation at 91% placed on 2L [place].

## 2015-05-05 NOTE — H&P (Signed)
[facility] NAME: Zachary [name]    MR#:  [id number]  DATE OF BIRTH:  [birth year]  DATE OF ADMISSION:  05/05/2015  PRIMARY CARE PHYSICIAN: Sofie Hartigan, MD   REQUESTING/REFERRING PHYSICIAN: Harvest Dark, MD  CHIEF COMPLAINT:   Chief Complaint  Patient presents with  . Chest Pain  Cough, shortness of breath and chest tightness for 2 days.  HISTORY OF PRESENT ILLNESS:  Zachary [name]  is a 69 y.o. male with a known history of COPD, hypertension and CAD. The patient presents to the ED with the above chief complaint. He has had the chest tightness,  Cough, wheezing and shortness of breath for 2 days.he denies any fever or chills.He was prescribed prednisone and Augmentin, which he took today.he has a tachycardia at 120 to 130, tachypnea with SATURATION at 89-90%.but the chest x-ray didn't show any infiltrate.  PAST MEDICAL HISTORY:   Past Medical History  Diagnosis Date  . COPD (chronic obstructive pulmonary disease) ([place])   . Coronary artery disease   . High cholesterol   . Hypertension   . Stroke [facility])     PAST SURGICAL HISTORY:   Past Surgical History  Procedure Laterality Date  . Cardiac surgery      CABG x 5  2007    SOCIAL HISTORY:   Social History  Substance Use Topics  . Smoking status: Former Research scientist (life sciences)  . Smokeless tobacco: Never Used  . Alcohol Use: No    FAMILY HISTORY:   Family History  Problem Relation Age of Onset  . Pancreatic cancer Mother   . Hypertension Father   . Hypertension Sister     DRUG ALLERGIES:   Allergies  Allergen Reactions  . Lipitor [Atorvastatin] Other (See Comments)    Pt states that he had a stroke while taking this medication and was advised by his neurologist not to take it.    . Sulfa Antibiotics Hives  . Advair Diskus [Fluticasone-Salmeterol] Rash    REVIEW OF SYSTEMS:  CONSTITUTIONAL: No fever, fatigue or weakness.  EYES: No blurred or double  vision.  EARS, NOSE, AND THROAT: No tinnitus or ear pain.  RESPIRATORY: Has cough, shortness of breath, wheezing but no hemoptysis.  CARDIOVASCULAR: has chest pain (tightness), no orthopnea, edema.  GASTROINTESTINAL: No nausea, vomiting, diarrhea or abdominal pain.  GENITOURINARY: No dysuria, hematuria.  ENDOCRINE: No polyuria, nocturia,  HEMATOLOGY: No anemia, easy bruising or bleeding SKIN: No rash or lesion. MUSCULOSKELETAL: No joint pain or arthritis.   NEUROLOGIC: No tingling, numbness, weakness.  PSYCHIATRY: No anxiety or depression.   MEDICATIONS AT HOME:   Prior to Admission medications   Medication Sig Start Date End Date Taking? Authorizing Provider  albuterol (PROVENTIL HFA;VENTOLIN HFA) 108 (90 Base) MCG/ACT inhaler Inhale 2 puffs into the lungs every 6 (six) hours as needed for wheezing or shortness of breath.   Yes Historical Provider, MD  amoxicillin-clavulanate (AUGMENTIN) 875-125 MG tablet Take 1 tablet by mouth 2 (two) times daily. 05/05/15 05/15/15 Yes Historical Provider, MD  aspirin EC 81 MG tablet Take 81 mg by mouth daily.   Yes Historical Provider, MD  diphenhydramine-acetaminophen (TYLENOL PM) 25-500 MG TABS tablet Take 2 tablets by mouth at bedtime.   Yes Historical Provider, MD  isosorbide mononitrate (IMDUR) 30 MG 24 hr tablet Take 30 mg by mouth at bedtime.    Yes Historical Provider, MD  metoprolol (LOPRESSOR) 50 MG tablet Take 50 mg by mouth 2 (two) times daily.  Yes Historical Provider, MD  predniSONE (DELTASONE) 10 MG tablet Take 10-60 mg by mouth daily. Pt is to take as a taper:  Six tablets on day 1, five tablets on day 2...(4,3,2,1). 05/05/15 05/11/15 Yes Historical Provider, MD  rosuvastatin (CRESTOR) 5 MG tablet Take 2.5 mg by mouth at bedtime.   Yes Historical Provider, MD      VITAL SIGNS:  Blood pressure 138/86, pulse 121, temperature 97.7 F (36.5 C), temperature source Oral, resp. rate 19, height 5\' 10"  (1.778 m), weight 97.977 kg (216 lb), SpO2 98  %.  PHYSICAL EXAMINATION:  GENERAL:  70 y.o.-year-old patient lying in the bed with no acute distress. Obese. EYES: Pupils equal, round, reactive to light and accommodation. No scleral icterus. Extraocular muscles intact.  HEENT: Head atraumatic, normocephalic. Oropharynx and nasopharynx clear.  NECK:  Supple, no jugular venous distention. No thyroid enlargement, no tenderness.  LUNGS: Diminished breath sounds bilaterally, expiratory wheezing, no rales,rhonchi or crepitation. No use of accessory muscles of respiration.  CARDIOVASCULAR: S1, S2 normal. No murmurs, rubs, or gallops.  ABDOMEN: Soft, nontender, nondistended. Bowel sounds present. No organomegaly or mass.  EXTREMITIES: No pedal edema, cyanosis, or clubbing.  NEUROLOGIC: Cranial nerves II through XII are intact. Muscle strength 5/5 in all extremities. Sensation intact. Gait not checked.  PSYCHIATRIC: The patient is alert and oriented x 3.  SKIN: No obvious rash, lesion, or ulcer.   LABORATORY PANEL:   CBC  Recent Labs Lab 05/05/15 1516  WBC 14.2*  HGB 16.4  HCT 48.4  PLT 175   ------------------------------------------------------------------------------------------------------------------  Chemistries   Recent Labs Lab 05/05/15 1516  NA 137  K 3.8  CL 103  CO2 21*  GLUCOSE 161*  BUN 14  CREATININE 1.05  CALCIUM 9.4   ------------------------------------------------------------------------------------------------------------------  Cardiac Enzymes  Recent Labs Lab 05/05/15 1516  TROPONINI <0.03   ------------------------------------------------------------------------------------------------------------------  RADIOLOGY:  Dg Chest 2 View  05/05/2015  CLINICAL DATA:  Acute chest pain. EXAM: CHEST  2 VIEW COMPARISON:  October 03, 2005. FINDINGS: The heart size and mediastinal contours are within normal limits. No pneumothorax or pleural effusion is noted. Status post coronary artery bypass graft. Both lungs  are clear. The visualized skeletal structures are unremarkable. IMPRESSION: No active cardiopulmonary disease. Electronically Signed   By: Marijo Conception, M.D.   On: 05/05/2015 15:54    EKG:   Orders placed or performed during the hospital encounter of 05/05/15  . EKG 12-Lead  . EKG 12-Lead  . ED EKG within 10 minutes  . ED EKG within 10 minutes    IMPRESSION AND PLAN:   COPD Exacerbation Acute bronchitis with leukocytosis and tachycardia The patient will be admitted to medical floor. I will start IV Solu-Medrol, DuoNeb when necessary, oxygen and it cannula and Zithromax, rocephin.  Hypertension. continue Lopressor and Imdur. CAD. continue aspirin and Crestor    All the records are reviewed and case discussed with ED provider. Management plans discussed with the patient, his wife and they are in agreement.  CODE STATUS: full code  TOTAL TIME TAKING CARE OF THIS PATIENT: 55 minutes.    Demetrios Loll M.D on 05/05/2015 at 7:16 PM  Between 7am to 6pm - Pager - [phone]  After 6pm go to [email or web address] - password [email or web address]  [facility]  Office  [phone]  CC: Primary care physician; [facility], Chrissie Noa, MD

## 2015-05-05 NOTE — ED Notes (Signed)
Increased oxygen to 3L due to patient sats remaining around 90-91% on 2L[employer]

## 2015-05-05 NOTE — ED Notes (Signed)
Report received care assumed.  Pt with no distress at this time, no co shob or pain, awaiting bed assignment.

## 2015-05-05 NOTE — ED Notes (Signed)
C/o generalized anterior chest pain.  Onset of symptoms last night around 180 after going for a walk.  Describes pain and aching and constant, radiating to both arms Left greater than right.  Seen by PCP this morning for same, given an breathing treatment, which helped symptoms for a while.  Also has been using rescue inhaler at home.

## 2015-05-06 ENCOUNTER — Encounter: Payer: Self-pay | Admitting: Radiology

## 2015-05-06 ENCOUNTER — Inpatient Hospital Stay: Payer: Medicare Other

## 2015-05-06 DIAGNOSIS — D72829 Elevated white blood cell count, unspecified: Secondary | ICD-10-CM

## 2015-05-06 DIAGNOSIS — R739 Hyperglycemia, unspecified: Secondary | ICD-10-CM

## 2015-05-06 DIAGNOSIS — J209 Acute bronchitis, unspecified: Secondary | ICD-10-CM

## 2015-05-06 DIAGNOSIS — J9601 Acute respiratory failure with hypoxia: Secondary | ICD-10-CM

## 2015-05-06 LAB — HEMOGLOBIN A1C: Hgb A1c MFr Bld: 5.8 % (ref 4.0–6.0)

## 2015-05-06 LAB — BASIC METABOLIC PANEL
ANION GAP: 7 (ref 5–15)
BUN: 17 mg/dL (ref 6–20)
CHLORIDE: 105 mmol/L (ref 101–111)
CO2: 24 mmol/L (ref 22–32)
Calcium: 8.8 mg/dL — ABNORMAL LOW (ref 8.9–10.3)
Creatinine, Ser: 1.09 mg/dL (ref 0.61–1.24)
GFR calc Af Amer: >60 mL/min (ref >60)
GFR calc non Af Amer: >60 mL/min (ref >60)
Glucose, Bld: 156 mg/dL — ABNORMAL HIGH (ref 65–99)
POTASSIUM: 4.3 mmol/L (ref 3.5–5.1)
SODIUM: 136 mmol/L (ref 135–145)

## 2015-05-06 LAB — CBC
HCT: 43.7 % (ref 40.0–52.0)
HEMOGLOBIN: 14.6 g/dL (ref 13.0–18.0)
MCH: 31.2 pg (ref 26.0–34.0)
MCHC: 33.4 g/dL (ref 32.0–36.0)
MCV: 93.4 fL (ref 80.0–100.0)
PLATELETS: 163 10*3/uL (ref 150–440)
RBC: 4.68 MIL/uL (ref 4.40–5.90)
RDW: 13.8 % (ref 11.5–14.5)
WBC: 10.6 10*3/uL (ref 3.8–10.6)

## 2015-05-06 LAB — FIBRIN DERIVATIVES D-DIMER (ARMC ONLY): Fibrin derivatives D-dimer (ARMC): 1207 — ABNORMAL HIGH (ref 0–499)

## 2015-05-06 MED ORDER — TIOTROPIUM BROMIDE MONOHYDRATE 18 MCG IN CAPS
18.0000 ug | ORAL_CAPSULE | Freq: Every day | RESPIRATORY_TRACT | Status: DC
  Administered 2015-05-06 – 2015-05-07 (×2): 18 ug via RESPIRATORY_TRACT
  Filled 2015-05-06: qty 5

## 2015-05-06 MED ORDER — AZITHROMYCIN 250 MG PO TABS
500.0000 mg | ORAL_TABLET | Freq: Every day | ORAL | Status: DC
  Administered 2015-05-06: 500 mg via ORAL
  Filled 2015-05-06: qty 2

## 2015-05-06 MED ORDER — ALBUTEROL SULFATE (2.5 MG/3ML) 0.083% IN NEBU
2.5000 mg | INHALATION_SOLUTION | RESPIRATORY_TRACT | Status: DC
  Administered 2015-05-06 – 2015-05-07 (×7): 2.5 mg via RESPIRATORY_TRACT
  Filled 2015-05-06 (×7): qty 3

## 2015-05-06 MED ORDER — IOHEXOL 350 MG/ML SOLN
75.0000 mL | Freq: Once | INTRAVENOUS | Status: AC | PRN
  Administered 2015-05-06: 17:00:00 75 mL via INTRAVENOUS

## 2015-05-06 MED ORDER — BUDESONIDE-FORMOTEROL FUMARATE 160-4.5 MCG/ACT IN AERO
2.0000 | INHALATION_SPRAY | Freq: Two times a day (BID) | RESPIRATORY_TRACT | Status: DC
  Administered 2015-05-06 – 2015-05-07 (×3): 2 via RESPIRATORY_TRACT
  Filled 2015-05-06: qty 6

## 2015-05-06 NOTE — Progress Notes (Addendum)
[place] [facility] [place] NAME: Zachary [name]    MR#:  [id number]  DATE OF BIRTH:  December 24, 1945  SUBJECTIVE:  CHIEF COMPLAINT:   Chief Complaint  Patient presents with  . Chest Pain   patient has 70 year old male with past medical history significant for history of COPD, hypertension, coronary artery disease who presents to the hospital with chest tightness, cough, wheezing or shortness of breath for 2 days, he was noted to be tachycardic with heart rate of 100 2230s and tachypneic. His oxygen saturations were as low as 89%. , Chest x-ray was unremarkable . The patient was initiated on Rocephin, Zithromax, nebulizer treatment, oxygen and improved . Continues of cough with little phlegm production . Wheezing has subsided   Review of Systems  Constitutional: Negative for fever, chills and weight loss.  HENT: Negative for congestion.   Eyes: Negative for blurred vision and double vision.  Respiratory: Positive for cough, sputum production, shortness of breath and wheezing.   Cardiovascular: Negative for chest pain, palpitations, orthopnea, leg swelling and PND.  Gastrointestinal: Negative for nausea, vomiting, abdominal pain, diarrhea, constipation and blood in stool.  Genitourinary: Negative for dysuria, urgency, frequency and hematuria.  Musculoskeletal: Negative for falls.  Neurological: Negative for dizziness, tremors, focal weakness and headaches.  Endo/Heme/Allergies: Does not bruise/bleed easily.  Psychiatric/Behavioral: Negative for depression. The patient does not have insomnia.     VITAL SIGNS: Blood pressure 128/54, pulse 112, temperature 98.3 F (36.8 C), temperature source Oral, resp. rate 20, height 5\' 10"  (1.778 m), weight 95.618 kg (210 lb 12.8 oz), SpO2 93 %.  PHYSICAL EXAMINATION:   GENERAL:  70 y.o.-year-old patient lying in the bed with no acute distress.  EYES: Pupils equal, round, reactive to light and accommodation.  No scleral icterus. Extraocular muscles intact.  HEENT: Head atraumatic, normocephalic. Oropharynx and nasopharynx clear.  NECK:  Supple, no jugular venous distention. No thyroid enlargement, no tenderness.  LUNGS. some breath diminished  sounds bilaterally, no wheezing,, few rhonchi , , but no crepitations. Not  using accessory muscles of respiration.  CARDIOVASCULAR: S1, S2 normal. No murmurs, rubs, or gallops.  ABDOMEN: Soft, nontender, nondistended. Bowel sounds present. No organomegaly or mass.  EXTREMITIES: No pedal edema, cyanosis, or clubbing.  NEUROLOGIC: Cranial nerves II through XII are intact. Muscle strength 5/5 in all extremities. Sensation intact. Gait not checked.  PSYCHIATRIC: The patient is alert and oriented x 3.  SKIN: No obvious rash, lesion, or ulcer.   ORDERS/RESULTS REVIEWED:   CBC  Recent Labs Lab 05/05/15 1516 05/06/15 0445  WBC 14.2* 10.6  HGB 16.4 14.6  HCT 48.4 43.7  PLT 175 163  MCV 90.9 93.4  MCH 30.8 31.2  MCHC 33.8 33.4  RDW 13.7 13.8   ------------------------------------------------------------------------------------------------------------------  Chemistries   Recent Labs Lab 05/05/15 1516 05/06/15 0445  NA 137 136  K 3.8 4.3  CL 103 105  CO2 21* 24  GLUCOSE 161* 156*  BUN 14 17  CREATININE 1.05 1.09  CALCIUM 9.4 8.8*  MG 1.7  --    ------------------------------------------------------------------------------------------------------------------ estimated creatinine clearance is 74.2 mL/min (by C-G formula based on Cr of 1.09). ------------------------------------------------------------------------------------------------------------------ No results for input(s): TSH, T4TOTAL, T3FREE, THYROIDAB in the last 72 hours.  Invalid input(s): FREET3  Cardiac Enzymes  Recent Labs Lab 05/05/15 1516  TROPONINI <0.03    ------------------------------------------------------------------------------------------------------------------ Invalid input(s): POCBNP ---------------------------------------------------------------------------------------------------------------  RADIOLOGY: Dg Chest 2 View  05/05/2015  CLINICAL DATA:  Acute chest pain. EXAM: CHEST  2  VIEW COMPARISON:  October 03, 2005. FINDINGS: The heart size and mediastinal contours are within normal limits. No pneumothorax or pleural effusion is noted. Status post coronary artery bypass graft. Both lungs are clear. The visualized skeletal structures are unremarkable. IMPRESSION: No active cardiopulmonary disease. Electronically Signed   By: Marijo Conception, M.D.   On: 05/05/2015 15:54    EKG:  Orders placed or performed during the hospital encounter of 05/05/15  . EKG 12-Lead  . EKG 12-Lead  . ED EKG within 10 minutes  . ED EKG within 10 minutes    ASSESSMENT AND PLAN:  Principal Problem:   COPD exacerbation ([place]) #1. Acute respiratory failure with hypoxia due to COPD exacerbation, wean oxygen as tolerated to room air #2. COPD exacerbation, continue current therapy with withdrawal, Symbicort, tiotropium, improving clinically #3. Acute bronchitis, continue Rocephin and Zithromax, get sputum cultures if possible #4. Hyperglycemia. Get hemoglobin A1c 5. Leukocytosis, resolved Management plans discussed with the patient, family and they are in agreement.   DRUG ALLERGIES:  Allergies  Allergen Reactions  . Peanuts [Peanut Oil] Anaphylaxis  . Lipitor [Atorvastatin] Other (See Comments)    Pt states that he had a stroke while taking this medication and was advised by his neurologist not to take it.    . Sulfa Antibiotics Hives  . Advair Diskus [Fluticasone-Salmeterol] Rash    CODE STATUS:     Code Status Orders        Start     Ordered   05/05/15 2010  Full code   Continuous     05/05/15 2009    Code Status History    Date Active  Date Inactive Code Status Order ID Comments User Context   This patient has a current code status but no historical code status.      TOTAL TIME TAKING CARE OF THIS PATIENT: 40 minutes.    Theodoro Grist M.D on 05/06/2015 at 12:08 PM  Between 7am to 6pm - Pager - [phone]  After 6pm go to [email or web address] - password [email or web address] [facility]  [facility]  Office  [phone]  CC: Primary care physician; [facility], Chrissie Noa, MD

## 2015-05-06 NOTE — Plan of Care (Signed)
Problem: Education: Goal: Knowledge of [facility] Education information/materials will improve Outcome: Progressing Pt likes to be called Zachary [name]    Past Medical History   Diagnosis  Date   .  COPD (chronic obstructive pulmonary disease) ([place])     .  Coronary artery disease     .  High cholesterol     .  Hypertension     .  Stroke [facility]              Pt is well controlled with home medications     Problem: Activity: Goal: Risk for activity intolerance will decrease Outcome: Progressing No c/o chest pain. Breathing tx PRN X's 2. ST on monitor. Continues on iv solu-medrol 60 mg Q 6 hrs. Continue to monitor

## 2015-05-06 NOTE — Progress Notes (Signed)
Patient weaned off of oxygen, now on RA. On 2L O2 via [place] his sats were 98%. Took him off of oxygen and sats dropped no lower than 93% over 15 minutes. Patient denies SOB, dyspnea, or chest pain. RR remains at 18. Will leave him on RA.

## 2015-05-06 NOTE — Progress Notes (Signed)
PHARMACIST - PHYSICIAN COMMUNICATION DR:   Bridgett Larsson CONCERNING: Antibiotic IV to Oral Route Change Policy  RECOMMENDATION: This patient is receiving Azithromycin by the intravenous route.  Based on criteria approved by the Pharmacy and Therapeutics Committee, the antibiotic(s) is/are being converted to the equivalent oral dose form(s).   DESCRIPTION: These criteria include:  Patient being treated for a respiratory tract infection, urinary tract infection, cellulitis or clostridium difficile associated diarrhea if on metronidazole  The patient is not neutropenic and does not exhibit a GI malabsorption state  The patient is eating (either orally or via tube) and/or has been taking other orally administered medications for a least 24 hours  The patient is improving clinically and has a Tmax < 100.5  If you have questions about this conversion, please contact the Pharmacy Department  []   [phone] )  [facility] [x]   [phone] )  [facility] []   [phone] )  [facility] []   [phone] )  [facility] []   [phone] )  [facility] Puna    Larene [place], PharmD

## 2015-05-06 NOTE — Progress Notes (Signed)
Patient concerned about using symbicort due to a reaction he has had to advair in the past. He describes his reaction in the past as a rash with an associated sore throat. I spoke to pharmacy and both medications are in the same class. Patient stated he would try the symbicort and see if he can tolerate this medication better than the advair. He was instructed to notify myself or the doctor if he notices any reaction to the symbicort. Will monitor him for any adverse reactions.

## 2015-05-07 LAB — BASIC METABOLIC PANEL
Anion gap: 10 (ref 5–15)
BUN: 22 mg/dL — AB (ref 6–20)
CHLORIDE: 105 mmol/L (ref 101–111)
CO2: 22 mmol/L (ref 22–32)
CREATININE: 1.04 mg/dL (ref 0.61–1.24)
Calcium: 8.7 mg/dL — ABNORMAL LOW (ref 8.9–10.3)
GFR calc Af Amer: >60 mL/min (ref >60)
GFR calc non Af Amer: >60 mL/min (ref >60)
Glucose, Bld: 152 mg/dL — ABNORMAL HIGH (ref 65–99)
POTASSIUM: 4.2 mmol/L (ref 3.5–5.1)
Sodium: 137 mmol/L (ref 135–145)

## 2015-05-07 MED ORDER — TIOTROPIUM BROMIDE MONOHYDRATE 18 MCG IN CAPS: 18.0000 ug | ORAL_CAPSULE | Freq: Every day | RESPIRATORY_TRACT | Status: AC

## 2015-05-07 MED ORDER — BUDESONIDE-FORMOTEROL FUMARATE 160-4.5 MCG/ACT IN AERO: 2.0000 | INHALATION_SPRAY | Freq: Two times a day (BID) | RESPIRATORY_TRACT | Status: AC

## 2015-05-07 MED ORDER — PREDNISONE 10 MG (21) PO TBPK: 10.0000 mg | ORAL_TABLET | Freq: Every day | ORAL | Status: DC

## 2015-05-07 MED ORDER — GUAIFENESIN ER 600 MG PO TB12: 600.0000 mg | ORAL_TABLET | Freq: Two times a day (BID) | ORAL | Status: AC

## 2015-05-07 MED ORDER — BENZONATATE 100 MG PO CAPS: 100.0000 mg | ORAL_CAPSULE | Freq: Three times a day (TID) | ORAL | Status: DC | PRN

## 2015-05-07 MED ORDER — ALBUTEROL SULFATE (2.5 MG/3ML) 0.083% IN NEBU: 2.5000 mg | INHALATION_SOLUTION | RESPIRATORY_TRACT | Status: DC

## 2015-05-07 MED ORDER — DEXTROMETHORPHAN POLISTIREX ER 30 MG/5ML PO SUER: 30.0000 mg | Freq: Two times a day (BID) | ORAL | Status: DC

## 2015-05-07 MED ORDER — FLUTICASONE PROPIONATE 50 MCG/ACT NA SUSP: 1.0000 | Freq: Every day | NASAL | Status: AC

## 2015-05-07 MED ORDER — AZITHROMYCIN 250 MG PO TABS: 250.0000 mg | ORAL_TABLET | Freq: Every day | ORAL | Status: DC

## 2015-05-07 NOTE — Progress Notes (Signed)
Patient ambulated two full laps around nurses station - O2 sats remained at 94% during ambulation with no shortness of breath. Madlyn Frankel, RN

## 2015-05-07 NOTE — Progress Notes (Signed)
Discharge instructions given and went over with patient and family at bedside. All questions answered. Paper  prescription given for Prednisone - others sent electronically to patients pharmacy. Patient discharged home with family via wheelchair by nursing staff. Madlyn Frankel, RN

## 2015-05-07 NOTE — Discharge Summary (Signed)
[place] at [place] NAME: Zachary [name]    MR#:  [id number]  DATE OF BIRTH:  Aug 27, 1945  DATE OF ADMISSION:  05/05/2015 ADMITTING PHYSICIAN: Demetrios Loll, MD  DATE OF DISCHARGE: 05/07/2015 10:06 AM  PRIMARY CARE PHYSICIAN: FELDPAUSCH, DALE E, MD     ADMISSION DIAGNOSIS:  COPD exacerbation ([place]) [J44.1] Chest pain, unspecified chest pain type [R07.9]  DISCHARGE DIAGNOSIS:  Principal Problem:   COPD exacerbation ([place]) Active Problems:   Acute respiratory failure with hypoxia ([facility])   Acute bronchitis   Leukocytosis   Hyperglycemia   SECONDARY DIAGNOSIS:   Past Medical History  Diagnosis Date  . COPD (chronic obstructive pulmonary disease) ([place])   . Coronary artery disease   . High cholesterol   . Hypertension   . Stroke ([place])   . CHF (congestive heart failure) ([place])     .pro HOSPITAL COURSE:   The patient is 70 year old male with past medical history significant for history of COPD, hypertension, coronary artery disease who presents to the hospital with chest tightness, cough, wheezing or shortness of breath for 2 days, he was noted to be tachycardic with heart rate of 110-120  and tachypneic. His oxygen saturations were as low as 89%. , Chest x-ray was unremarkable . The patient was initiated on Rocephin, Zithromax, nebulizer treatment, oxygen and improved . Continued to have some cough with little phlegm production . Wheezing has subsided . It was felt that patient is stable to be discharged home today Discussion by problem #1. Acute respiratory failure with hypoxia due to COPD exacerbation, the patient was weaned to room air and remained stable on room air on exertion. Patient did have CT angiogram of the chest which showed no pulmonary embolism but COPD with mild bronchitic changes #2. COPD exacerbation, continue current therapy with tapering doses of steroids, Symbicort, tiotropium, duo nebs, prescription for nebulizer  will be given upon discharge. Patient is to follow-up with primary care physician for recommendations #3. Acute bronchitis, continue  Zithromax to complete course, unable to get sputum cultures #4. Hyperglycemia. hemoglobin A1c was found to be 5.8, no diabetes 5. Leukocytosis, resolved  DISCHARGE CONDITIONS:   Stable  CONSULTS OBTAINED:     DRUG ALLERGIES:   Allergies  Allergen Reactions  . Peanuts [Peanut Oil] Anaphylaxis  . Lipitor [Atorvastatin] Other (See Comments)    Pt states that he had a stroke while taking this medication and was advised by his neurologist not to take it.    . Sulfa Antibiotics Hives  . Advair Diskus [Fluticasone-Salmeterol] Rash    DISCHARGE MEDICATIONS:   Discharge Medication List as of 05/07/2015  9:29 AM    START taking these medications   Details  albuterol (PROVENTIL) (2.5 MG/3ML) 0.083% nebulizer solution Take 3 mLs (2.5 mg total) by nebulization every 4 (four) hours., Starting 05/07/2015, Until Discontinued, Normal    azithromycin (ZITHROMAX) 250 MG tablet Take 1 tablet (250 mg total) by mouth daily., Starting 05/07/2015, Until Discontinued, Normal    benzonatate (TESSALON) 100 MG capsule Take 1 capsule (100 mg total) by mouth 3 (three) times daily as needed for cough., Starting 05/07/2015, Until Discontinued, Normal    budesonide-formoterol (SYMBICORT) 160-4.5 MCG/ACT inhaler Inhale 2 puffs into the lungs 2 (two) times daily., Starting 05/07/2015, Until Discontinued, Normal    dextromethorphan (DELSYM) 30 MG/5ML liquid Take 5 mLs (30 mg total) by mouth 2 (two) times daily., Starting 05/07/2015, Until Discontinued, Normal    fluticasone (FLONASE) 50 MCG/ACT nasal spray  Place 1 spray into both nostrils daily., Starting 05/07/2015, Until Discontinued, Normal    guaiFENesin (MUCINEX) 600 MG 12 hr tablet Take 1 tablet (600 mg total) by mouth 2 (two) times daily., Starting 05/07/2015, Until Discontinued, Normal    predniSONE (STERAPRED UNI-PAK 21 TAB) 10 MG  (21) TBPK tablet Take 1 tablet (10 mg total) by mouth daily. please take 6 pills once in the morning of the first day, then taper by 1 pill daily until finished, thanks, Starting 05/07/2015, Until Discontinued, Normal    tiotropium (SPIRIVA) 18 MCG inhalation capsule Place 1 capsule (18 mcg total) into inhaler and inhale daily., Starting 05/07/2015, Until Discontinued, Normal      CONTINUE these medications which have NOT CHANGED   Details  albuterol (PROVENTIL HFA;VENTOLIN HFA) 108 (90 Base) MCG/ACT inhaler Inhale 2 puffs into the lungs every 6 (six) hours as needed for wheezing or shortness of breath., Until Discontinued, Historical Med    aspirin EC 81 MG tablet Take 81 mg by mouth daily., Until Discontinued, Historical Med    diphenhydramine-acetaminophen (TYLENOL PM) 25-500 MG TABS tablet Take 2 tablets by mouth at bedtime., Until Discontinued, Historical Med    isosorbide mononitrate (IMDUR) 30 MG 24 hr tablet Take 30 mg by mouth at bedtime. , Until Discontinued, Historical Med    metoprolol (LOPRESSOR) 50 MG tablet Take 50 mg by mouth 2 (two) times daily., Until Discontinued, Historical Med    rosuvastatin (CRESTOR) 5 MG tablet Take 2.5 mg by mouth at bedtime., Until Discontinued, Historical Med      STOP taking these medications     amoxicillin-clavulanate (AUGMENTIN) 875-125 MG tablet      predniSONE (DELTASONE) 10 MG tablet          DISCHARGE INSTRUCTIONS:    Patient is to follow-up with primary care physician as outpatient  If you experience worsening of your admission symptoms, develop shortness of breath, life threatening emergency, suicidal or homicidal thoughts you must seek medical attention immediately by calling 911 or calling your MD immediately  if symptoms less severe.  You Must read complete instructions/literature along with all the possible adverse reactions/side effects for all the Medicines you take and that have been prescribed to you. Take any new  Medicines after you have completely understood and accept all the possible adverse reactions/side effects.   Please note  You were cared for by a hospitalist during your hospital stay. If you have any questions about your discharge medications or the care you received while you were in the hospital after you are discharged, you can call the unit and asked to speak with the hospitalist on call if the hospitalist that took care of you is not available. Once you are discharged, your primary care physician will handle any further medical issues. Please note that NO REFILLS for any discharge medications will be authorized once you are discharged, as it is imperative that you return to your primary care physician (or establish a relationship with a primary care physician if you do not have one) for your aftercare needs so that they can reassess your need for medications and monitor your lab values.    Today   CHIEF COMPLAINT:   Chief Complaint  Patient presents with  . Chest Pain    HISTORY OF PRESENT ILLNESS:  Zachary [name]  is a 70 y.o. male with a known history of COPD, hypertension, coronary artery disease who presents to the hospital with chest tightness, cough, wheezing or shortness of breath for 2  days, he was noted to be tachycardic with heart rate of 110-120  and tachypneic. His oxygen saturations were as low as 89%. , Chest x-ray was unremarkable . The patient was initiated on Rocephin, Zithromax, nebulizer treatment, oxygen and improved . Continued to have some cough with little phlegm production . Wheezing has subsided . It was felt that patient is stable to be discharged home today Discussion by problem #1. Acute respiratory failure with hypoxia due to COPD exacerbation, the patient was weaned to room air and remained stable on room air on exertion. Patient did have CT angiogram of the chest which showed no pulmonary embolism but COPD with mild bronchitic changes #2. COPD exacerbation,  continue current therapy with tapering doses of steroids, Symbicort, tiotropium, duo nebs, prescription for nebulizer will be given upon discharge. Patient is to follow-up with primary care physician for recommendations #3. Acute bronchitis, continue  Zithromax to complete course, unable to get sputum cultures #4. Hyperglycemia. hemoglobin A1c was found to be 5.8, no diabetes 5. Leukocytosis, resolved    VITAL SIGNS:  Blood pressure 107/56, pulse 96, temperature 98 F (36.7 C), temperature source Oral, resp. rate 18, height 5\' 10"  (1.778 m), weight 95.618 kg (210 lb 12.8 oz), SpO2 94 %.  I/O:   Intake/Output Summary (Last 24 hours) at 05/07/15 1359 Last data filed at 05/07/15 0800  Gross per 24 hour  Intake    480 ml  Output      0 ml  Net    480 ml    PHYSICAL EXAMINATION:  GENERAL:  70 y.o.-year-old patient lying in the bed with no acute distress.  EYES: Pupils equal, round, reactive to light and accommodation. No scleral icterus. Extraocular muscles intact.  HEENT: Head atraumatic, normocephalic. Oropharynx and nasopharynx clear.  NECK:  Supple, no jugular venous distention. No thyroid enlargement, no tenderness.  LUNGS: Normal breath sounds bilaterally, no wheezing, rales,rhonchi or crepitation. No use of accessory muscles of respiration.  CARDIOVASCULAR: S1, S2 normal. No murmurs, rubs, or gallops.  ABDOMEN: Soft, non-tender, non-distended. Bowel sounds present. No organomegaly or mass.  EXTREMITIES: No pedal edema, cyanosis, or clubbing.  NEUROLOGIC: Cranial nerves II through XII are intact. Muscle strength 5/5 in all extremities. Sensation intact. Gait not checked.  PSYCHIATRIC: The patient is alert and oriented x 3.  SKIN: No obvious rash, lesion, or ulcer.   DATA REVIEW:   CBC  Recent Labs Lab 05/06/15 0445  WBC 10.6  HGB 14.6  HCT 43.7  PLT 163    Chemistries   Recent Labs Lab 05/05/15 1516  05/07/15 0535  NA 137  < > 137  K 3.8  < > 4.2  CL 103  < >  105  CO2 21*  < > 22  GLUCOSE 161*  < > 152*  BUN 14  < > 22*  CREATININE 1.05  < > 1.04  CALCIUM 9.4  < > 8.7*  MG 1.7  --   --   < > = values in this interval not displayed.  Cardiac Enzymes  Recent Labs Lab 05/05/15 [place] <0.03    Microbiology Results  No results found for this or any previous visit.  RADIOLOGY:  Dg Chest 2 View  05/05/2015  CLINICAL DATA:  Acute chest pain. EXAM: CHEST  2 VIEW COMPARISON:  October 03, 2005. FINDINGS: The heart size and mediastinal contours are within normal limits. No pneumothorax or pleural effusion is noted. Status post coronary artery bypass graft. Both lungs are clear. The  visualized skeletal structures are unremarkable. IMPRESSION: No active cardiopulmonary disease. Electronically Signed   By: Marijo Conception, M.D.   On: 05/05/2015 15:54   Ct Angio Chest Pe W/cm &/or Wo Cm  05/06/2015  CLINICAL DATA:  Chest tightness cough wheezing shortness of breath for 2 days, hypoxia with tachycardia, elevated D-dimer EXAM: CT ANGIOGRAPHY CHEST WITH CONTRAST TECHNIQUE: Multidetector CT imaging of the chest was performed using the standard protocol during bolus administration of intravenous contrast. Multiplanar CT image reconstructions and MIPs were obtained to evaluate the vascular anatomy. CONTRAST:  72mL OMNIPAQUE IOHEXOL 350 MG/ML SOLN COMPARISON:  05/05/2015 FINDINGS: Mediastinum/Nodes: Thyroid and thoracic inlet are normal. 17 mm right hilar lymph node. 11 mm sub carinal lymph node. 12 mm precarinal lymph node. No pericardial effusion. Status post CABG with coronary artery calcification noted. Abdominal aorta shows no dissection or dilatation. There is mild to moderate atherosclerotic calcification of the abdominal aorta. At the time of imaging contrast is equally in the pulmonary and systemic arterial system rendering evaluation of third order peripheral pulmonary arterial branches suboptimal. Allowing for this, no pulmonary arterial filling defects  are identified. Lungs/Pleura: Mild bronchial wall thickening in the mid to lower lung zones bilaterally. No consolidation. No bullous change. Mild hyperinflation. No significant pericardial effusion. Upper abdomen: Unremarkable Musculoskeletal: No acute findings Review of the MIP images confirms the above findings. IMPRESSION: Evidence of COPD with mild bronchitic change. Mildly limited evaluation of pulmonary arterial system showing no evidence of emboli. No acute findings. Thoracic adenopathy of unknown significance, possibly reactive. Electronically Signed   By: Skipper Cliche M.D.   On: 05/06/2015 18:01    EKG:   Orders placed or performed during the hospital encounter of 05/05/15  . EKG 12-Lead  . EKG 12-Lead  . ED EKG within 10 minutes  . ED EKG within 10 minutes      Management plans discussed with the patient, family and they are in agreement.  CODE STATUS:  Code Status History    Date Active Date Inactive Code Status Order ID Comments User Context   05/05/2015  8:09 PM 05/07/2015  1:55 PM Full Code [id number]  Demetrios Loll, MD Inpatient      TOTAL TIME TAKING CARE OF THIS PATIENT: 40 minutes.  Discussed with patient's family, all questions were answered  Everett Ricciardelli M.D on 05/07/2015 at 1:59 PM  Between 7am to 6pm - Pager - [phone]  After 6pm go to [email or web address] - password [email or web address] [facility]  [place] [facility]  Office  [phone]  CC: Primary care physician; [facility], Chrissie Noa, MD

## 2015-05-07 NOTE — Progress Notes (Signed)
Attempted to call patient to come back to pick up nebulizer - unable to contact patient with available contact information. Madlyn Frankel, RN

## 2015-05-07 NOTE — Care Management Note (Signed)
Case Management Note  Patient Details  Name: Zachary [name] MRN: [id number] Date of Birth: September 04, 1945  Subjective/Objective:     Zachary Zachary [name] was discharged and left the unit prior to receiving his home nebulizer machine. Nursing staff have tried to contact Zachary [name] by phone to pick up his home nebulizer at [facility] on nursing station 1[address]C.                Action/Plan:   Expected Discharge Date:                  Expected Discharge Plan:     In-House Referral:     Discharge planning Services     Post Acute Care Choice:    Choice offered to:     DME Arranged:    DME Agency:     HH Arranged:    [place] Agency:     Status of Service:     Medicare Important Message Given:    Date Medicare IM Given:    Medicare IM give by:    Date Additional Medicare IM Given:    Additional Medicare Important Message give by:     If discussed at [place] of Stay Meetings, dates discussed:    Additional Comments:  Marcina Kinnison A, RN 05/07/2015, 3:44 PM

## 2015-05-08 MED ORDER — ALBUTEROL SULFATE HFA 108 (90 BASE) MCG/ACT IN AERS: 2.0000 | INHALATION_SPRAY | Freq: Four times a day (QID) | RESPIRATORY_TRACT | Status: DC | PRN

## 2015-05-08 MED ORDER — ALBUTEROL SULFATE (2.5 MG/3ML) 0.083% IN NEBU: 2.5000 mg | INHALATION_SOLUTION | RESPIRATORY_TRACT | Status: AC | PRN

## 2016-10-24 ENCOUNTER — Emergency Department: Payer: Medicare Other

## 2016-10-24 ENCOUNTER — Observation Stay
Admission: EM | Admit: 2016-10-24 | Discharge: 2016-10-25 | Disposition: A | Discharge status: Home / Self Care | Payer: Medicare Other | Attending: Internal Medicine | Admitting: Internal Medicine

## 2016-10-24 ENCOUNTER — Encounter: Payer: Self-pay | Admitting: Emergency Medicine

## 2016-10-24 DIAGNOSIS — I110 Hypertensive heart disease with heart failure: Secondary | ICD-10-CM | POA: Diagnosis not present

## 2016-10-24 DIAGNOSIS — K122 Cellulitis and abscess of mouth: Principal | ICD-10-CM | POA: Diagnosis present

## 2016-10-24 DIAGNOSIS — Z8673 Personal history of transient ischemic attack (TIA), and cerebral infarction without residual deficits: Secondary | ICD-10-CM | POA: Insufficient documentation

## 2016-10-24 DIAGNOSIS — J449 Chronic obstructive pulmonary disease, unspecified: Secondary | ICD-10-CM | POA: Diagnosis not present

## 2016-10-24 DIAGNOSIS — Z882 Allergy status to sulfonamides status: Secondary | ICD-10-CM | POA: Diagnosis not present

## 2016-10-24 DIAGNOSIS — Z87891 Personal history of nicotine dependence: Secondary | ICD-10-CM | POA: Diagnosis not present

## 2016-10-24 DIAGNOSIS — Z7951 Long term (current) use of inhaled steroids: Secondary | ICD-10-CM | POA: Insufficient documentation

## 2016-10-24 DIAGNOSIS — I509 Heart failure, unspecified: Secondary | ICD-10-CM | POA: Diagnosis not present

## 2016-10-24 DIAGNOSIS — Z79899 Other long term (current) drug therapy: Secondary | ICD-10-CM | POA: Insufficient documentation

## 2016-10-24 DIAGNOSIS — I2510 Atherosclerotic heart disease of native coronary artery without angina pectoris: Secondary | ICD-10-CM | POA: Diagnosis not present

## 2016-10-24 DIAGNOSIS — E7800 Pure hypercholesterolemia, unspecified: Secondary | ICD-10-CM | POA: Diagnosis not present

## 2016-10-24 DIAGNOSIS — Z7982 Long term (current) use of aspirin: Secondary | ICD-10-CM | POA: Diagnosis not present

## 2016-10-24 DIAGNOSIS — Z91010 Allergy to peanuts: Secondary | ICD-10-CM | POA: Insufficient documentation

## 2016-10-24 DIAGNOSIS — Z7952 Long term (current) use of systemic steroids: Secondary | ICD-10-CM | POA: Insufficient documentation

## 2016-10-24 DIAGNOSIS — Z951 Presence of aortocoronary bypass graft: Secondary | ICD-10-CM | POA: Insufficient documentation

## 2016-10-24 LAB — COMPREHENSIVE METABOLIC PANEL
ALBUMIN: 4.3 g/dL (ref 3.5–5.0)
ALK PHOS: 73 U/L (ref 38–126)
ALT: 17 U/L (ref 17–63)
AST: 16 U/L (ref 15–41)
Anion gap: 8 (ref 5–15)
BILIRUBIN TOTAL: 0.6 mg/dL (ref 0.3–1.2)
BUN: 16 mg/dL (ref 6–20)
CALCIUM: 9.3 mg/dL (ref 8.9–10.3)
CO2: 26 mmol/L (ref 22–32)
Chloride: 106 mmol/L (ref 101–111)
Creatinine, Ser: 1.13 mg/dL (ref 0.61–1.24)
GFR calc Af Amer: >60 mL/min (ref >60)
GFR calc non Af Amer: >60 mL/min (ref >60)
GLUCOSE: 95 mg/dL (ref 65–99)
Potassium: 4.1 mmol/L (ref 3.5–5.1)
Sodium: 140 mmol/L (ref 135–145)
TOTAL PROTEIN: 7.5 g/dL (ref 6.5–8.1)

## 2016-10-24 LAB — CBC
HCT: 44.8 % (ref 40.0–52.0)
Hemoglobin: 15 g/dL (ref 13.0–18.0)
MCH: 31.1 pg (ref 26.0–34.0)
MCHC: 33.4 g/dL (ref 32.0–36.0)
MCV: 92.9 fL (ref 80.0–100.0)
Platelets: 180 10*3/uL (ref 150–440)
RBC: 4.82 MIL/uL (ref 4.40–5.90)
RDW: 13.3 % (ref 11.5–14.5)
WBC: 10.7 10*3/uL — ABNORMAL HIGH (ref 3.8–10.6)

## 2016-10-24 MED ORDER — ISOSORBIDE MONONITRATE ER 30 MG PO TB24
30.0000 mg | ORAL_TABLET | Freq: Every day | ORAL | Status: DC
  Administered 2016-10-24: 30 mg via ORAL
  Filled 2016-10-24: qty 1

## 2016-10-24 MED ORDER — ALBUTEROL SULFATE (2.5 MG/3ML) 0.083% IN NEBU: 2.5000 mg | INHALATION_SOLUTION | RESPIRATORY_TRACT | Status: DC | PRN

## 2016-10-24 MED ORDER — ROSUVASTATIN CALCIUM 5 MG PO TABS
2.5000 mg | ORAL_TABLET | Freq: Every day | ORAL | Status: DC
  Administered 2016-10-24: 2.5 mg via ORAL
  Filled 2016-10-24 (×2): qty 0.5

## 2016-10-24 MED ORDER — AMPICILLIN-SULBACTAM SODIUM 3 (2-1) G IJ SOLR
3.0000 g | Freq: Once | INTRAMUSCULAR | Status: AC
  Administered 2016-10-24: 3 g via INTRAVENOUS
  Filled 2016-10-24 (×2): qty 3

## 2016-10-24 MED ORDER — FLUTICASONE PROPIONATE 50 MCG/ACT NA SUSP
1.0000 | Freq: Every day | NASAL | Status: DC
  Filled 2016-10-24: qty 16

## 2016-10-24 MED ORDER — ACETAMINOPHEN 325 MG PO TABS: 650.0000 mg | ORAL_TABLET | Freq: Four times a day (QID) | ORAL | Status: DC | PRN

## 2016-10-24 MED ORDER — MOMETASONE FURO-FORMOTEROL FUM 200-5 MCG/ACT IN AERO
2.0000 | INHALATION_SPRAY | Freq: Two times a day (BID) | RESPIRATORY_TRACT | Status: DC
  Administered 2016-10-24: 2 via RESPIRATORY_TRACT
  Filled 2016-10-24: qty 8.8

## 2016-10-24 MED ORDER — METHYLPREDNISOLONE SODIUM SUCC 125 MG IJ SOLR
60.0000 mg | Freq: Two times a day (BID) | INTRAMUSCULAR | Status: DC
  Administered 2016-10-24 – 2016-10-25 (×2): 60 mg via INTRAVENOUS
  Filled 2016-10-24 (×2): qty 2

## 2016-10-24 MED ORDER — SODIUM CHLORIDE 0.9 % IV SOLN
3.0000 g | Freq: Four times a day (QID) | INTRAVENOUS | Status: DC
  Administered 2016-10-25 (×2): 3 g via INTRAVENOUS
  Filled 2016-10-24 (×5): qty 3

## 2016-10-24 MED ORDER — METOPROLOL TARTRATE 50 MG PO TABS
50.0000 mg | ORAL_TABLET | Freq: Two times a day (BID) | ORAL | Status: DC
  Administered 2016-10-24 – 2016-10-25 (×2): 50 mg via ORAL
  Filled 2016-10-24 (×2): qty 1

## 2016-10-24 MED ORDER — ASPIRIN EC 81 MG PO TBEC
81.0000 mg | DELAYED_RELEASE_TABLET | Freq: Every day | ORAL | Status: DC
  Administered 2016-10-25: 81 mg via ORAL
  Filled 2016-10-24: qty 1

## 2016-10-24 MED ORDER — DEXAMETHASONE SODIUM PHOSPHATE 10 MG/ML IJ SOLN
10.0000 mg | Freq: Once | INTRAMUSCULAR | Status: AC
  Administered 2016-10-24: 10 mg via INTRAVENOUS
  Filled 2016-10-24 (×2): qty 1

## 2016-10-24 MED ORDER — ALBUTEROL SULFATE HFA 108 (90 BASE) MCG/ACT IN AERS: 2.0000 | INHALATION_SPRAY | Freq: Four times a day (QID) | RESPIRATORY_TRACT | Status: DC | PRN

## 2016-10-24 MED ORDER — ALBUTEROL SULFATE (2.5 MG/3ML) 0.083% IN NEBU
2.5000 mg | INHALATION_SOLUTION | RESPIRATORY_TRACT | Status: DC
  Administered 2016-10-25: 2.5 mg via RESPIRATORY_TRACT
  Filled 2016-10-24 (×2): qty 3

## 2016-10-24 MED ORDER — TIOTROPIUM BROMIDE MONOHYDRATE 18 MCG IN CAPS
18.0000 ug | ORAL_CAPSULE | Freq: Every day | RESPIRATORY_TRACT | Status: DC
  Administered 2016-10-25: 18 ug via RESPIRATORY_TRACT
  Filled 2016-10-24: qty 5

## 2016-10-24 MED ORDER — ACETAMINOPHEN 650 MG RE SUPP
650.0000 mg | Freq: Four times a day (QID) | RECTAL | Status: DC | PRN
  Filled 2016-10-24: qty 1

## 2016-10-24 MED ORDER — IOPAMIDOL (ISOVUE-300) INJECTION 61%
75.0000 mL | Freq: Once | INTRAVENOUS | Status: AC | PRN
  Administered 2016-10-24: 75 mL via INTRAVENOUS
  Filled 2016-10-24: qty 75

## 2016-10-24 NOTE — ED Provider Notes (Signed)
[facility] Emergency Department Provider Note    First MD Initiated Contact with Patient 10/24/16 1735     (approximate)  I have reviewed the triage vital signs and the nursing notes.   HISTORY  Chief Complaint Neck swelling    HPI Zachary [name] is a 71 y.o. male history of COPD CHF and CAD who presents with acute onset left submandibular regular swelling and pain with sensation of difficulty swallowing. No fevers. States his symptoms came on roughly around noon.  Patient is not on any sort of ACE inhibitor or are. Denies any chest pain or shortness of breath. States that he does feel like is progressively swelling. Patient caught his primary care physician instructed him to come to the ER.  Recent dental work. No recent sore throat.   Past Medical History:  Diagnosis Date  . CHF (congestive heart failure) ([place])   . COPD (chronic obstructive pulmonary disease) ([place])   . Coronary artery disease   . High cholesterol   . Hypertension   . Stroke [facility])    Family History  Problem Relation Age of Onset  . Pancreatic cancer Mother   . Hypertension Father   . Hypertension Sister    Past Surgical History:  Procedure Laterality Date  . CARDIAC SURGERY     CABG x 5  2007   Patient Active Problem List   Diagnosis Date Noted  . Acute respiratory failure with hypoxia ([place]) 05/06/2015  . Acute bronchitis 05/06/2015  . Leukocytosis 05/06/2015  . Hyperglycemia 05/06/2015  . COPD exacerbation ([place]) 05/05/2015      Prior to Admission medications   Medication Sig Start Date End Date Taking? Authorizing Provider  albuterol (PROVENTIL HFA;VENTOLIN HFA) 108 (90 Base) MCG/ACT inhaler Inhale 2 puffs into the lungs every 6 (six) hours as needed for wheezing or shortness of breath. 05/08/15   Theodoro Grist, MD  albuterol (PROVENTIL) (2.5 MG/3ML) 0.083% nebulizer solution Take 3 mLs (2.5 mg total) by nebulization every 4 (four) hours. 05/07/15   Theodoro Grist, MD   albuterol (PROVENTIL) (2.5 MG/3ML) 0.083% nebulizer solution Take 3 mLs (2.5 mg total) by nebulization every 4 (four) hours as needed for wheezing or shortness of breath. 05/08/15   Theodoro Grist, MD  aspirin EC 81 MG tablet Take 81 mg by mouth daily.    [provider]  azithromycin (ZITHROMAX) 250 MG tablet Take 1 tablet (250 mg total) by mouth daily. 05/07/15   Theodoro Grist, MD  benzonatate (TESSALON) 100 MG capsule Take 1 capsule (100 mg total) by mouth 3 (three) times daily as needed for cough. 05/07/15   Theodoro Grist, MD  budesonide-formoterol (SYMBICORT) 160-4.5 MCG/ACT inhaler Inhale 2 puffs into the lungs 2 (two) times daily. 05/07/15   Theodoro Grist, MD  dextromethorphan (DELSYM) 30 MG/5ML liquid Take 5 mLs (30 mg total) by mouth 2 (two) times daily. 05/07/15   Theodoro Grist, MD  diphenhydramine-acetaminophen (TYLENOL PM) 25-500 MG TABS tablet Take 2 tablets by mouth at bedtime.    [provider]  fluticasone (FLONASE) 50 MCG/ACT nasal spray Place 1 spray into both nostrils daily. 05/07/15   Theodoro Grist, MD  guaiFENesin (MUCINEX) 600 MG 12 hr tablet Take 1 tablet (600 mg total) by mouth 2 (two) times daily. 05/07/15   Theodoro Grist, MD  isosorbide mononitrate (IMDUR) 30 MG 24 hr tablet Take 30 mg by mouth at bedtime.     [provider]  metoprolol (LOPRESSOR) 50 MG tablet Take 50 mg by mouth  2 (two) times daily.    [provider]  predniSONE (STERAPRED UNI-PAK 21 TAB) 10 MG (21) TBPK tablet Take 1 tablet (10 mg total) by mouth daily. please take 6 pills once in the morning of the first day, then taper by 1 pill daily until finished, thanks 05/07/15   Theodoro Grist, MD  rosuvastatin (CRESTOR) 5 MG tablet Take 2.5 mg by mouth at bedtime.    [provider]  tiotropium (SPIRIVA) 18 MCG inhalation capsule Place 1 capsule (18 mcg total) into inhaler and inhale daily. 05/07/15   Theodoro Grist, MD    Allergies Peanuts [peanut oil]; Lipitor  [atorvastatin]; Sulfa antibiotics; and Advair diskus [fluticasone-salmeterol]    Social History Social History  Substance Use Topics  . Smoking status: Former Research scientist (life sciences)  . Smokeless tobacco: Never Used  . Alcohol use No    Review of Systems Patient denies headaches, rhinorrhea, blurry vision, numbness, shortness of breath, chest pain, edema, cough, abdominal pain, nausea, vomiting, diarrhea, dysuria, fevers, rashes or hallucinations unless otherwise stated above in HPI. ____________________________________________   PHYSICAL EXAM:  VITAL SIGNS: Vitals:   10/24/16 1609 10/24/16 1815  BP: (!) 149/76 (!) 146/93  Pulse: 75 71  Resp: 18 17  Temp: 98.8 F (37.1 C)     Constitutional: Alert and oriented. Well appearing and in no acute distress. Eyes: Conjunctivae are normal.  Head: Atraumatic. Nose: No congestion/rhinnorhea. Mouth/Throat: Mucous membranes are moist.  Uvula midline, no pta or rp swelling Neck: No stridor. Painless ROM. + submadnibular swelling and woody induration of legt side. No trismus Cardiovascular: Normal rate, regular rhythm. Grossly normal heart sounds.  Good peripheral circulation. Respiratory: Normal respiratory effort.  No retractions. Lungs CTAB. Gastrointestinal: Soft and nontender. No distention. No abdominal bruits. No CVA tenderness. Genitourinary:  Musculoskeletal: No lower extremity tenderness nor edema.  No joint effusions. Neurologic:  Normal speech and language. No gross focal neurologic deficits are appreciated. No facial droop Skin:  Skin is warm, dry and intact. No rash noted. Psychiatric: Mood and affect are normal. Speech and behavior are normal.  ____________________________________________   LABS (all labs ordered are listed, but only abnormal results are displayed)  Results for orders placed or performed during the hospital encounter of 10/24/16 (from the past 24 hour(s))  CBC     Status: Abnormal   Collection Time: 10/24/16  4:17  PM  Result Value Ref Range   WBC 10.7 (H) 3.8 - 10.6 K/uL   RBC 4.82 4.40 - 5.90 MIL/uL   Hemoglobin 15.0 13.0 - 18.0 g/dL   HCT 44.8 40.0 - 52.0 %   MCV 92.9 80.0 - 100.0 fL   MCH 31.1 26.0 - 34.0 pg   MCHC 33.4 32.0 - 36.0 g/dL   RDW 13.3 11.5 - 14.5 %   Platelets 180 150 - 440 K/uL  Comprehensive metabolic panel     Status: None   Collection Time: 10/24/16  4:17 PM  Result Value Ref Range   Sodium 140 135 - 145 mmol/L   Potassium 4.1 3.5 - 5.1 mmol/L   Chloride 106 101 - 111 mmol/L   CO2 26 22 - 32 mmol/L   Glucose, Bld 95 65 - 99 mg/dL   BUN 16 6 - 20 mg/dL   Creatinine, Ser 1.13 0.61 - 1.24 mg/dL   Calcium 9.3 8.9 - 10.3 mg/dL   Total Protein 7.5 6.5 - 8.1 g/dL   Albumin 4.3 3.5 - 5.0 g/dL   AST 16 15 - 41 U/L   ALT  17 17 - 63 U/L   Alkaline Phosphatase 73 38 - 126 U/L   Total Bilirubin 0.6 0.3 - 1.2 mg/dL   GFR calc non Af Amer >60 >60 mL/min   GFR calc Af Amer >60 >60 mL/min   Anion gap 8 5 - 15   ____________________________________________  EKG____________________________________________  RADIOLOGY  I personally reviewed all radiographic images ordered to evaluate for the above acute complaints and reviewed radiology reports and findings.  These findings were personally discussed with the patient.  Please see medical record for radiology report.  ____________________________________________   PROCEDURES  Procedure(s) performed:  Procedures    Critical Care performed: no ____________________________________________   INITIAL IMPRESSION / ASSESSMENT AND PLAN / ED COURSE  Pertinent labs & imaging results that were available during my care of the patient were reviewed by me and considered in my medical decision making (see chart for details).  DDX: ludwigs angina, parotitis, glositis, epiglottitism, strep  Zachary [name] is a 71 y.o. who presents to the ED with some diffuse swelling as described above. CT imaging ordered to evaluate for above  differential. Patient does have mild leukocytosis. Patient is not on any sort of a sore arm to suggest angioedema. Based on CT imaging his presentation is more concerning for acute Ludwig angina. He has no airway involvement this moment I do feel the patient will require admission for IV antibiotics IV steroids and close hemodynamic monitoring.  Have discussed with the patient and available family all diagnostics and treatments performed thus far and all questions were answered to the best of my ability. The patient demonstrates understanding and agreement with plan.       ____________________________________________   FINAL CLINICAL IMPRESSION(S) / ED DIAGNOSES  Final diagnoses:  Ludwig's angina      NEW MEDICATIONS STARTED DURING THIS VISIT:  New Prescriptions   No medications on file     Note:  This document was prepared using Dragon voice recognition software and may include unintentional dictation errors.    Merlyn Lot, MD 10/24/16 [phone]

## 2016-10-24 NOTE — Progress Notes (Signed)
Pharmacy Antibiotic Note  Zachary [name] is a 71 y.o. male admitted on 10/24/2016 with Ludwig's angina.  Pharmacy has been consulted for Unasyn dosing.  Plan: Unasyn 3g IV every 6 hours.  Height: 5\' 10"  (177.8 cm) Weight: 210 lb (95.3 kg) IBW/kg (Calculated) : 73  Temp (24hrs), Avg:98.8 F (37.1 C), Min:98.8 F (37.1 C), Max:98.8 F (37.1 C)   Recent Labs Lab 10/24/16 1617  WBC 10.7*  CREATININE 1.13    Estimated Creatinine Clearance: 70.5 mL/min (by C-G formula based on SCr of 1.13 mg/dL).    Allergies  Allergen Reactions  . Peanuts [Peanut Oil] Anaphylaxis  . Lipitor [Atorvastatin] Other (See Comments)    Pt states that he had a stroke while taking this medication and was advised by his neurologist not to take it.    . Sulfa Antibiotics Hives  . Advair Diskus [Fluticasone-Salmeterol] Rash    Antimicrobials this admission: Unasyn 7/26 >>     Dose adjustments this admission: N/A  Microbiology results: 7/26 BCx: pending  Thank you for allowing pharmacy to be a part of this patient's care.  Olivia Canter, [facility] 10/24/2016 8:55 PM

## 2016-10-24 NOTE — H&P (Signed)
Zachary [name] is an 71 y.o. male.   Chief Complaint: Jaw swelling HPI: This is 71 year old male who today at work around lunchtime status left side of his tongue felt odd. He then noted some left-sided swelling under his ear. It became worse during the day so he came to the ER for evaluation. He's found to have bilateral submandibular edema.  Past Medical History:  Diagnosis Date  . CHF (congestive heart failure) ([place])   . COPD (chronic obstructive pulmonary disease) ([place])   . Coronary artery disease   . High cholesterol   . Hypertension   . Stroke [facility])     Past Surgical History:  Procedure Laterality Date  . CARDIAC SURGERY     CABG x 5  2007    Family History  Problem Relation Age of Onset  . Pancreatic cancer Mother   . Hypertension Father   . Hypertension Sister    Social History:  reports that he has quit smoking. He has never used smokeless tobacco. He reports that he does not drink alcohol or use drugs.  Allergies:  Allergies  Allergen Reactions  . Peanuts [Peanut Oil] Anaphylaxis  . Lipitor [Atorvastatin] Other (See Comments)    Pt states that he had a stroke while taking this medication and was advised by his neurologist not to take it.    . Sulfa Antibiotics Hives  . Advair Diskus [Fluticasone-Salmeterol] Rash     (Not in a hospital admission)  Results for orders placed or performed during the hospital encounter of 10/24/16 (from the past 48 hour(s))  CBC     Status: Abnormal   Collection Time: 10/24/16  4:17 PM  Result Value Ref Range   WBC 10.7 (H) 3.8 - 10.6 K/uL   RBC 4.82 4.40 - 5.90 MIL/uL   Hemoglobin 15.0 13.0 - 18.0 g/dL   HCT 44.8 40.0 - 52.0 %   MCV 92.9 80.0 - 100.0 fL   MCH 31.1 26.0 - 34.0 pg   MCHC 33.4 32.0 - 36.0 g/dL   RDW 13.3 11.5 - 14.5 %   Platelets 180 150 - 440 K/uL  Comprehensive metabolic panel     Status: None   Collection Time: 10/24/16  4:17 PM  Result Value Ref Range   Sodium 140 135 - 145 mmol/L   Potassium 4.1 3.5  - 5.1 mmol/L   Chloride 106 101 - 111 mmol/L   CO2 26 22 - 32 mmol/L   Glucose, Bld 95 65 - 99 mg/dL   BUN 16 6 - 20 mg/dL   Creatinine, Ser 1.13 0.61 - 1.24 mg/dL   Calcium 9.3 8.9 - 10.3 mg/dL   Total Protein 7.5 6.5 - 8.1 g/dL   Albumin 4.3 3.5 - 5.0 g/dL   AST 16 15 - 41 U/L   ALT 17 17 - 63 U/L   Alkaline Phosphatase 73 38 - 126 U/L   Total Bilirubin 0.6 0.3 - 1.2 mg/dL   GFR calc non Af Amer >60 >60 mL/min   GFR calc Af Amer >60 >60 mL/min    Comment: (NOTE) The eGFR has been calculated using the CKD EPI equation. This calculation has not been validated in all clinical situations. eGFR's persistently <60 mL/min signify possible Chronic Kidney Disease.    Anion gap 8 5 - 15   Ct Soft Tissue Neck W Contrast  Result Date: 10/24/2016 CLINICAL DATA:  Neck swelling. EXAM: CT NECK WITH CONTRAST TECHNIQUE: Multidetector CT imaging of the neck was performed using the standard  protocol following the bolus administration of intravenous contrast. CONTRAST:  73m ISOVUE-300 IOPAMIDOL (ISOVUE-300) INJECTION 61% COMPARISON:  None. FINDINGS: Pharynx and larynx: --Nasopharynx: Fossae of Rosenmuller are clear. Normal adenoid tonsils for age. --Oral cavity and oropharynx: The palatine and lingual tonsils are normal. The visible oral cavity and floor of mouth are normal. --Hypopharynx: Normal vallecula and pyriform sinuses. --Larynx: Normal epiglottis and pre-epiglottic space. Normal aryepiglottic and vocal folds. --Retropharyngeal space: No abscess, effusion or lymphadenopathy. Salivary glands: --Parotid: No mass lesion or inflammation. No sialolithiasis or ductal dilatation. --Submandibular: There is mild inflammatory change surrounding the left submandibular gland. The proximal aspect of the submandibular duct is mildly dilated. No stone or other obstructing lesion is seen along the course of the duct. There is a stone in the right submandibular duct, measuring 6 x 5 mm, but the appearance of right  submandibular gland is normal. The proximal right submandibular duct is dilated. --Sublingual: Normal. No ranula or other visible lesion of the base of tongue and floor of mouth. Thyroid: Normal. Lymph nodes: No enlarged or abnormal density lymph nodes. Vascular: Mild bilateral carotid bifurcation atherosclerosis without hemodynamically significant stenosis. Aortic atherosclerotic calcification. Limited intracranial: Normal. Visualized orbits: Normal. Mastoids and visualized paranasal sinuses: Mild bilateral maxillary sinus mucosal thickening. No mastoid effusion. Skeleton: No bony spinal canal stenosis. No lytic or blastic lesions. Upper chest: Clear. Other: None. IMPRESSION: 1. Inflammatory change surrounding the LEFT submandibular gland. Mild proximal LEFT submandibular duct dilatation without sialolithiasis or other visualized source of obstruction. 2. Proximal RIGHT submandibular duct stone with upstream submandibular ductal dilatation. No evident inflammatory change of the RIGHT submandibular gland. 3.  Aortic Atherosclerosis (ICD10-I70.0). Electronically Signed   By: KUlyses JarredM.D.   On: 10/24/2016 17:54    Review of Systems  Constitutional: Negative for chills and fever.  HENT: Negative for hearing loss.   Eyes: Negative for blurred vision.  Respiratory: Negative for cough.   Cardiovascular: Negative for chest pain.  Gastrointestinal: Negative for nausea and vomiting.  Genitourinary: Negative for dysuria.  Musculoskeletal: Negative for myalgias.  Skin: Negative for rash.  Neurological: Negative for dizziness.    Blood pressure (!) 146/93, pulse 71, temperature 98.8 F (37.1 C), temperature source Oral, resp. rate 17, height _0  (1.778 m), weight 95.3 kg (210 lb), SpO2 97 %. Physical Exam  Constitutional: He is oriented to person, place, and time. He appears well-developed and well-nourished.  HENT:  Bilateral submandibular edema but no tenderness. Oral mucosa is moist. No airway  obstruction.  Eyes: Pupils are equal, round, and reactive to light. EOM are normal. No scleral icterus.  Neck: Neck supple. No JVD present. No tracheal deviation present. No thyromegaly present.  Cardiovascular: Normal rate and regular rhythm.   No murmur heard. Respiratory: Breath sounds normal. No respiratory distress. He has no wheezes. He has no rales. He exhibits no tenderness.  GI: Soft. Bowel sounds are normal. He exhibits no distension. There is no tenderness. There is no rebound and no guarding.  Musculoskeletal: Normal range of motion. He exhibits no edema.  Lymphadenopathy:    He has no cervical adenopathy.  Neurological: He is alert and oriented to person, place, and time. No cranial nerve deficit.  Skin: Skin is warm and dry.     Assessment/Plan 1.Ludwigs Angina; we'll start him on IV Unasyn. We'll also give IV Solu-Medrol. We'll observe overnight closely for airway issues. 2. Coronary artery disease. Appears to be stable we'll continue current medications 3. COPD. No sign of exacerbation. 4. Hypertension.  Continue current medications  Total time spent 30 minutes  Baxter Hire, MD 10/24/2016, 8:49 PM

## 2016-10-24 NOTE — ED Triage Notes (Addendum)
Pt reports sudden onset of neck swelling today around lunch time. Pt reports tongues feels sore like he may have bitten it but he didn't. Tongue without obvious swelling. Denies any pain. Obvious swelling to left side neck. Pt states the left side of his tongue is the side that is sore and when he swallows it feels sore. Speech clear.

## 2016-10-25 DIAGNOSIS — K122 Cellulitis and abscess of mouth: Secondary | ICD-10-CM | POA: Diagnosis not present

## 2016-10-25 MED ORDER — PREDNISONE 10 MG (21) PO TBPK: 10.0000 mg | ORAL_TABLET | Freq: Every day | ORAL | 0 refills | Status: DC

## 2016-10-25 MED ORDER — AMOXICILLIN-POT CLAVULANATE 875-125 MG PO TABS: 1.0000 | ORAL_TABLET | Freq: Two times a day (BID) | ORAL | 0 refills | Status: AC

## 2016-10-25 NOTE — Discharge Instructions (Signed)
Drink plenty of fluids.  Candy or mint to salivate more

## 2016-10-25 NOTE — Progress Notes (Signed)
Pharmacy Antibiotic Note  Zachary [name] is a 71 y.o. male admitted on 10/24/2016 with Ludwig's angina.  Pharmacy has been consulted for Unasyn dosing.  Plan: Will continue Unasyn 3g IV every 6 hours.  Height: 5\' 11"  (180.3 cm) Weight: 215 lb 13.3 oz (97.9 kg) IBW/kg (Calculated) : 75.3  Temp (24hrs), Avg:98.5 F (36.9 C), Min:98.1 F (36.7 C), Max:98.8 F (37.1 C)   Recent Labs Lab 10/24/16 1617  WBC 10.7*  CREATININE 1.13    Estimated Creatinine Clearance: 72.5 mL/min (by C-G formula based on SCr of 1.13 mg/dL).    Allergies  Allergen Reactions  . Peanuts [Peanut Oil] Anaphylaxis  . Lipitor [Atorvastatin] Other (See Comments)    Pt states that he had a stroke while taking this medication and was advised by his neurologist not to take it.    . Sulfa Antibiotics Hives  . Advair Diskus [Fluticasone-Salmeterol] Rash    Antimicrobials this admission: Unasyn 7/26 >>     Dose adjustments this admission: N/A  Microbiology results: 7/26 BCx: pending  Thank you for allowing pharmacy to be a part of this patient's care.  Napoleon Form, [facility] 10/25/2016 7:45 AM

## 2016-10-25 NOTE — Care Management Obs Status (Signed)
MEDICARE OBSERVATION STATUS NOTIFICATION   Patient Details  Name: Zachary [name] MRN: [id number] Date of Birth: [birth year]   Medicare Observation Status Notification Given:  Yes    Beverly Sessions, RN 10/25/2016, 11:26 AM

## 2016-10-25 NOTE — Progress Notes (Signed)
Pt. Has a severity score of 5 on the RT protocol assessment. He and his wife stated they didn't think the pt. Needs breathing treatments. I told them I could change the treatments to prn and to ask for one if he felt SOB.

## 2016-10-25 NOTE — Progress Notes (Signed)
Patient discharged to home as ordered, discharge instructions given as ordered, follow up appointments given as ordered. Iv discontinued site clean dry and intact. Patient alert and oriented no acute distress noted.

## 2016-10-28 NOTE — Discharge Summary (Signed)
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 CHF (congestive heart failure) ([place])   . COPD (chronic obstructive pulmonary disease) ([place])   . Coronary artery disease   . High cholesterol   . Hypertension   . Stroke [facility])      ADMITTING HISTORY  Chief Complaint: Jaw swelling HPI: This is 71 year old male who today at work around lunchtime status left side of his tongue felt odd. He then noted some left-sided swelling under his ear. It became worse during the day so he came to the ER for evaluation. He's found to have bilateral submandibular edema.  HOSPITAL COURSE:   * Right submandibular gland sialadenitis Patient was admitted for IV steroids and antibiotics. Improved well and by day of discharge did not have any pain or trouble swallowing. The left submandibular gland duct showed a 5 mm stone but no signs of infection on that side. Discussed with Dr. Richardson Landry of ENT. Advised Augmentin and prednisone. Follow up as outpatient in the office.  Patient is afebrile. No leukocytosis on the day of discharge.  Stable for discharge home.  CONSULTS OBTAINED:    DRUG ALLERGIES:   Allergies  Allergen Reactions  . Peanuts [Peanut Oil] Anaphylaxis  . Lipitor [Atorvastatin] Other (See Comments)    Pt states that he had a stroke while taking this medication and was advised by his neurologist not to take it.    . Sulfa Antibiotics Hives  . Advair Diskus [Fluticasone-Salmeterol] Rash    DISCHARGE MEDICATIONS:   Discharge Medication List as of 10/25/2016 12:12 PM     START taking these medications   Details  amoxicillin-clavulanate (AUGMENTIN) 875-125 MG tablet Take 1 tablet by mouth 2 (two) times daily., Starting Fri 10/25/2016, Until Fri 11/08/2016, Normal      CONTINUE these medications which have CHANGED   Details  predniSONE (STERAPRED UNI-PAK 21 TAB) 10 MG (21) TBPK tablet Take 1 tablet (10 mg total) by mouth daily. Take 6 pills once in the morning of the first day, then taper by 1 pill daily until finished, Starting Fri 10/25/2016, Normal      CONTINUE these medications which have NOT CHANGED   Details  albuterol (PROVENTIL HFA;VENTOLIN HFA) 108 (90 Base) MCG/ACT inhaler Inhale 2 puffs into the lungs every 6 (six) hours as needed for wheezing or shortness of breath., Starting Mon 05/08/2015, Normal    !! albuterol (PROVENTIL) (2.5 MG/3ML) 0.083% nebulizer solution Take 3 mLs (2.5 mg total) by nebulization every 4 (four) hours., Starting Sun 05/07/2015, Normal    !! albuterol (PROVENTIL) (2.5 MG/3ML) 0.083% nebulizer solution Take 3 mLs (2.5 mg total) by nebulization every 4 (four) hours as needed for wheezing or shortness of breath., Starting Mon 05/08/2015, Normal    aspirin EC 81 MG tablet Take 81 mg by mouth daily., Until Discontinued, Historical Med    budesonide-formoterol (SYMBICORT) 160-4.5 MCG/ACT inhaler Inhale 2 puffs into the lungs 2 (two) times daily., Starting Sun 05/07/2015, Normal    diphenhydramine-acetaminophen (TYLENOL PM) 25-500 MG TABS tablet Take 2 tablets by mouth at bedtime., Until  Discontinued, Historical Med    fluticasone (FLONASE) 50 MCG/ACT nasal spray Place 1 spray into both nostrils daily., Starting Sun 05/07/2015, Normal    guaiFENesin (MUCINEX) 600 MG 12 hr tablet Take 1 tablet (600 mg total) by mouth 2 (two) times daily., Starting Sun 05/07/2015, Normal    isosorbide mononitrate (IMDUR) 30 MG 24 hr tablet Take 30 mg by mouth at bedtime. , Until Discontinued, Historical Med    metoprolol (LOPRESSOR) 50 MG tablet Take 50  mg by mouth 2 (two) times daily., Until Discontinued, Historical Med    rosuvastatin (CRESTOR) 5 MG tablet Take 2.5 mg by mouth at bedtime., Until Discontinued, Historical Med    tiotropium (SPIRIVA) 18 MCG inhalation capsule Place 1 capsule (18 mcg total) into inhaler and inhale daily., Starting Sun 05/07/2015, Normal     !! - Potential duplicate medications found. Please discuss with provider.    STOP taking these medications     benzonatate (TESSALON) 100 MG capsule      dextromethorphan (DELSYM) 30 MG/5ML liquid         Today   VITAL SIGNS:  Blood pressure 131/73, pulse 85, temperature 98.5 F (36.9 C), temperature source Oral, resp. rate 18, height 5\' 11"  (1.803 m), weight 97.9 kg (215 lb 13.3 oz), SpO2 95 %.  I/O:  No intake or output data in the 24 hours ending 10/28/16 1447  PHYSICAL EXAMINATION:  Physical Exam  GENERAL:  71 y.o.-year-old patient lying in the bed with no acute distress.  LUNGS: Normal breath sounds bilaterally, no wheezing, rales,rhonchi or crepitation. No use of accessory muscles of respiration.  CARDIOVASCULAR: S1, S2 normal. No murmurs, rubs, or gallops.  ABDOMEN: Soft, non-tender, non-distended. Bowel sounds present. No organomegaly or mass.  NEUROLOGIC: Moves all 4 extremities. PSYCHIATRIC: The patient is alert and oriented x 3.  SKIN: No obvious rash, lesion, or ulcer.   DATA REVIEW:   CBC  Recent Labs Lab 10/24/16 1617  WBC 10.7*  HGB 15.0  HCT 44.8  PLT 180    Chemistries   Recent Labs Lab 10/24/16 1617  NA 140  K 4.1  CL 106  CO2 26  GLUCOSE 95  BUN 16  CREATININE 1.13  CALCIUM 9.3  AST 16  ALT 17  ALKPHOS 73  BILITOT 0.6    Cardiac Enzymes No results for input(s): TROPONINI in the last 168 hours.  Microbiology Results  Results for orders placed or performed during the hospital encounter of 10/24/16  Blood culture (routine x 2)     Status: None (Preliminary result)   Collection Time: 10/24/16  7:00 PM  Result  Value Ref Range Status   Specimen Description BLOOD BLOOD RIGHT HAND  Final   Special Requests   Final    BOTTLES DRAWN AEROBIC AND ANAEROBIC Blood Culture adequate volume   Culture NO GROWTH 4 DAYS  Final   Report Status PENDING  Incomplete  Blood culture (routine x 2)     Status: None (Preliminary result)   Collection Time: 10/24/16  7:00 PM  Result Value Ref Range Status   Specimen Description BLOOD RIGHT ANTECUBITAL  Final   Special Requests   Final    BOTTLES DRAWN AEROBIC AND ANAEROBIC Blood Culture adequate volume   Culture NO GROWTH 4 DAYS  Final   Report Status PENDING  Incomplete    RADIOLOGY:  No results found.  Follow up with PCP in 1 week.  Management plans discussed with the patient, family and they are in agreement.  CODE  STATUS:  Code Status History    Date Active Date Inactive Code Status Order ID Comments User Context   10/24/2016  8:49 PM 10/25/2016  3:53 PM Full Code [id number]  Baxter Hire, MD ED   05/05/2015  8:09 PM 05/07/2015  1:55 PM Full Code [id number]  Demetrios Loll, MD Inpatient      TOTAL TIME TAKING CARE OF THIS PATIENT ON DAY OF DISCHARGE: more than 30 minutes.   Hillary Bow R M.D on 10/28/2016 at 2:47 PM  Between 7am to 6pm - Pager - [phone]  After 6pm go to [email or web address] - password [email or web address] [facility]  Office  [phone]  CC: Primary care physician; Sofie Hartigan, MD  Note: This dictation was prepared with Dragon dictation along with smaller phrase technology. Any transcriptional errors that result from this process are unintentional.

## 2016-10-29 LAB — CULTURE, BLOOD (ROUTINE X 2)
Culture: NO GROWTH
Culture: NO GROWTH
Special Requests: ADEQUATE
Special Requests: ADEQUATE

## 2017-07-02 NOTE — Discharge Instructions (Signed)

## 2017-07-07 ENCOUNTER — Ambulatory Visit: Payer: Medicare Other | Admitting: Anesthesiology

## 2017-07-07 ENCOUNTER — Ambulatory Visit
Admission: RE | Admit: 2017-07-07 | Discharge: 2017-07-07 | Disposition: A | Discharge status: Home / Self Care | Payer: Medicare Other | Source: Ambulatory Visit | Attending: Ophthalmology | Admitting: Ophthalmology

## 2017-07-07 ENCOUNTER — Encounter
Admission: RE | Disposition: A | Discharge status: Home / Self Care | Payer: Self-pay | Source: Ambulatory Visit | Attending: Ophthalmology

## 2017-07-07 DIAGNOSIS — Z6831 Body mass index (BMI) 31.0-31.9, adult: Secondary | ICD-10-CM | POA: Diagnosis not present

## 2017-07-07 DIAGNOSIS — Z951 Presence of aortocoronary bypass graft: Secondary | ICD-10-CM | POA: Insufficient documentation

## 2017-07-07 DIAGNOSIS — H2512 Age-related nuclear cataract, left eye: Secondary | ICD-10-CM | POA: Diagnosis not present

## 2017-07-07 DIAGNOSIS — Z85820 Personal history of malignant melanoma of skin: Secondary | ICD-10-CM | POA: Diagnosis not present

## 2017-07-07 DIAGNOSIS — Z87891 Personal history of nicotine dependence: Secondary | ICD-10-CM | POA: Diagnosis not present

## 2017-07-07 DIAGNOSIS — I10 Essential (primary) hypertension: Secondary | ICD-10-CM | POA: Insufficient documentation

## 2017-07-07 DIAGNOSIS — Z8673 Personal history of transient ischemic attack (TIA), and cerebral infarction without residual deficits: Secondary | ICD-10-CM | POA: Insufficient documentation

## 2017-07-07 DIAGNOSIS — I2510 Atherosclerotic heart disease of native coronary artery without angina pectoris: Secondary | ICD-10-CM | POA: Insufficient documentation

## 2017-07-07 DIAGNOSIS — J449 Chronic obstructive pulmonary disease, unspecified: Secondary | ICD-10-CM | POA: Insufficient documentation

## 2017-07-07 DIAGNOSIS — G4730 Sleep apnea, unspecified: Secondary | ICD-10-CM | POA: Diagnosis not present

## 2017-07-07 HISTORY — DX: Orthopnea: R06.01

## 2017-07-07 HISTORY — DX: Cough: R05

## 2017-07-07 HISTORY — PX: CATARACT EXTRACTION W/PHACO: SHX586

## 2017-07-07 HISTORY — DX: Dyspnea, unspecified: R06.00

## 2017-07-07 HISTORY — DX: Chronic cough: R05.3

## 2017-07-07 HISTORY — DX: Unspecified asthma, uncomplicated: J45.909

## 2017-07-07 HISTORY — DX: Other seasonal allergic rhinitis: J30.2

## 2017-07-07 HISTORY — DX: Malignant melanoma of skin, unspecified: C43.9

## 2017-07-07 SURGERY — PHACOEMULSIFICATION, CATARACT, WITH IOL INSERTION
Anesthesia: Monitor Anesthesia Care | Site: Eye | Laterality: Left | Wound class: Clean

## 2017-07-07 MED ORDER — MIDAZOLAM HCL 2 MG/2ML IJ SOLN
INTRAMUSCULAR | Status: DC | PRN
  Administered 2017-07-07: 2 mg via INTRAVENOUS

## 2017-07-07 MED ORDER — ARMC OPHTHALMIC DILATING DROPS
1.0000 "application " | OPHTHALMIC | Status: DC | PRN
  Administered 2017-07-07 (×3): 1 via OPHTHALMIC

## 2017-07-07 MED ORDER — OXYCODONE HCL 5 MG/5ML PO SOLN: 5.0000 mg | Freq: Once | ORAL | Status: DC | PRN

## 2017-07-07 MED ORDER — FENTANYL CITRATE (PF) 100 MCG/2ML IJ SOLN
INTRAMUSCULAR | Status: DC | PRN
  Administered 2017-07-07: 50 ug via INTRAVENOUS

## 2017-07-07 MED ORDER — LACTATED RINGERS IV SOLN: INTRAVENOUS | Status: DC

## 2017-07-07 MED ORDER — MOXIFLOXACIN HCL 0.5 % OP SOLN
1.0000 [drp] | OPHTHALMIC | Status: DC | PRN
  Administered 2017-07-07 (×3): 1 [drp] via OPHTHALMIC

## 2017-07-07 MED ORDER — CEFUROXIME OPHTHALMIC INJECTION 1 MG/0.1 ML
INJECTION | OPHTHALMIC | Status: DC | PRN
  Administered 2017-07-07: 0.1 mL via INTRACAMERAL

## 2017-07-07 MED ORDER — BALANCED SALT IO SOLN
INTRAOCULAR | Status: DC | PRN
  Administered 2017-07-07: 1 mL

## 2017-07-07 MED ORDER — OXYCODONE HCL 5 MG PO TABS: 5.0000 mg | ORAL_TABLET | Freq: Once | ORAL | Status: DC | PRN

## 2017-07-07 MED ORDER — BSS IO SOLN
INTRAOCULAR | Status: DC | PRN
  Administered 2017-07-07: 75 mL via OPHTHALMIC

## 2017-07-07 MED ORDER — NA HYALUR & NA CHOND-NA HYALUR 0.4-0.35 ML IO KIT
PACK | INTRAOCULAR | Status: DC | PRN
  Administered 2017-07-07: 1 mL via INTRAOCULAR

## 2017-07-07 MED ORDER — BRIMONIDINE TARTRATE-TIMOLOL 0.2-0.5 % OP SOLN
OPHTHALMIC | Status: DC | PRN
  Administered 2017-07-07: 1 [drp] via OPHTHALMIC

## 2017-07-07 SURGICAL SUPPLY — 20 items
CANNULA ANT/CHMB 27G (MISCELLANEOUS) ×1 IMPLANT
CANNULA ANT/CHMB 27GA (MISCELLANEOUS) ×3 IMPLANT
GLOVE SURG LX 7.5 STRW (GLOVE) ×2
GLOVE SURG LX STRL 7.5 STRW (GLOVE) ×1 IMPLANT
GLOVE SURG TRIUMPH 8.0 PF LTX (GLOVE) ×3 IMPLANT
GOWN STRL REUS W/ TWL LRG LVL3 (GOWN DISPOSABLE) ×2 IMPLANT
GOWN STRL REUS W/TWL LRG LVL3 (GOWN DISPOSABLE) ×6
LENS IOL TECNIS ITEC 20.0 (Intraocular Lens) ×2 IMPLANT
MARKER SKIN DUAL TIP RULER LAB (MISCELLANEOUS) ×3 IMPLANT
NDL FILTER BLUNT 18X1 1/2 (NEEDLE) ×1 IMPLANT
NEEDLE FILTER BLUNT 18X 1/2SAF (NEEDLE) ×2
NEEDLE FILTER BLUNT 18X1 1/2 (NEEDLE) ×1 IMPLANT
PACK CATARACT BRASINGTON (MISCELLANEOUS) ×3 IMPLANT
PACK EYE AFTER SURG (MISCELLANEOUS) ×3 IMPLANT
PACK OPTHALMIC (MISCELLANEOUS) ×3 IMPLANT
SYR 3ML LL SCALE MARK (SYRINGE) ×3 IMPLANT
SYR 5ML LL (SYRINGE) ×3 IMPLANT
SYR TB 1ML LUER SLIP (SYRINGE) ×3 IMPLANT
WATER STERILE IRR 500ML POUR (IV SOLUTION) ×3 IMPLANT
WIPE NON LINTING 3.25X3.25 (MISCELLANEOUS) ×3 IMPLANT

## 2017-07-07 NOTE — Anesthesia Preprocedure Evaluation (Signed)
Anesthesia Evaluation  Patient identified by MRN, date of birth, ID band  Reviewed: NPO status   History of Anesthesia Complications Negative for: history of anesthetic complications  Airway Mallampati: II  TM Distance: >3 FB Neck ROM: full    Dental no notable dental hx.    Pulmonary neg pulmonary ROS, sleep apnea (no cpap) , COPD,  COPD inhaler, former smoker,  Chronic cough   Pulmonary exam normal        Cardiovascular Exercise Tolerance: Good hypertension, + CAD and + CABG (2007)  Normal cardiovascular exam   cards stable: 05/2017: dr. Saralyn Pilar;   CABG x5, LIMA to LAD, SVG to D1, PDA, SVG to ramus / OM1, 08/21/2005   ETT sestamibi study 05/09/2014 revealed LVEF of 66% with moderate inferior scar without ischemia. 2D echocardiogram on 05/09/2014 revealed normal left ventricular function with LVEF 50-55%, mild to moderate tricuspid regurgitation and mild mitral regurgitation.      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), No Residual Symptoms negative psych ROS   GI/Hepatic negative GI ROS, Neg liver ROS,   Endo/Other  negative endocrine ROSMorbid obesity (bmi=32)  Renal/GU negative Renal ROS  negative genitourinary   Musculoskeletal   Abdominal   Peds  Hematology melanoma   Anesthesia Other Findings   Reproductive/Obstetrics                             Anesthesia Physical Anesthesia Plan  ASA: III  Anesthesia Plan: MAC   Post-op Pain Management:    Induction:   PONV Risk Score and Plan:   Airway Management Planned:   Additional Equipment:   Intra-op Plan:   Post-operative Plan:   Informed Consent: I have reviewed the patients History and Physical, chart, labs and discussed the procedure including the risks, benefits and alternatives for the proposed anesthesia with the patient or authorized representative who has indicated his/her understanding and  acceptance.     Plan Discussed with: CRNA  Anesthesia Plan Comments:         Anesthesia Quick Evaluation

## 2017-07-07 NOTE — Anesthesia Procedure Notes (Signed)
Procedure Name: MAC Date/Time: 07/07/2017 9:15 AM Performed by: Janna Arch, CRNA Pre-anesthesia Checklist: Patient identified, Emergency Drugs available, Suction available and Patient being monitored Patient Re-evaluated:Patient Re-evaluated prior to induction Oxygen Delivery Method: Nasal cannula

## 2017-07-07 NOTE — Transfer of Care (Signed)
Immediate Anesthesia Transfer of Care Note  Patient: Zachary [name]  Procedure(s) Performed: CATARACT EXTRACTION PHACO AND INTRAOCULAR LENS PLACEMENT ([place]) (Left Eye)  Patient Location: PACU  Anesthesia Type: MAC  Level of Consciousness: awake, alert  and patient cooperative  Airway and Oxygen Therapy: Patient Spontanous Breathing and Patient connected to supplemental oxygen  Post-op Assessment: Post-op Vital signs reviewed, Patient's Cardiovascular Status Stable, Respiratory Function Stable, Patent Airway and No signs of Nausea or vomiting  Post-op Vital Signs: Reviewed and stable  Complications: No apparent anesthesia complications

## 2017-07-07 NOTE — H&P (Signed)
The History and Physical notes are on paper, have been signed, and are to be scanned. The patient remains stable and unchanged from the H&P.   Previous H&P reviewed, patient examined, and there are no changes.  Leah Thornberry 07/07/2017 8:43 AM

## 2017-07-07 NOTE — Op Note (Signed)
OPERATIVE NOTE  Zachary [name] [id number] 07/07/2017   PREOPERATIVE DIAGNOSIS:  Nuclear sclerotic cataract left eye. H25.12   POSTOPERATIVE DIAGNOSIS:    Nuclear sclerotic cataract left eye.     PROCEDURE:  Phacoemusification with posterior chamber intraocular lens placement of the left eye   LENS:   Implant Name Type Inv. Item Serial No. Manufacturer Lot No. LRB No. Used  LENS IOL DIOP 20.0 - [id number] Intraocular Lens LENS IOL DIOP 20.0 [id number] AMO  Left 1        ULTRASOUND TIME: 15  % of 1 minutes 33 seconds, CDE 14.2  SURGEON:  Wyonia Hough, MD   ANESTHESIA:  Topical with tetracaine drops and 2% Xylocaine jelly, augmented with 1% preservative-free intracameral lidocaine.    COMPLICATIONS:  None.   DESCRIPTION OF PROCEDURE:  The patient was identified in the holding room and transported to the operating room and placed in the supine position under the operating microscope.  The left eye was identified as the operative eye and it was prepped and draped in the usual sterile ophthalmic fashion.   A 1 millimeter clear-corneal paracentesis was made at the 1:30 position.  0.5 ml of preservative-free 1% lidocaine was injected into the anterior chamber.  The anterior chamber was filled with Viscoat viscoelastic.  A 2.4 millimeter keratome was used to make a near-clear corneal incision at the 10:30 position.  .  A curvilinear capsulorrhexis was made with a cystotome and capsulorrhexis forceps.  Balanced salt solution was used to hydrodissect and hydrodelineate the nucleus.   Phacoemulsification was then used in stop and chop fashion to remove the lens nucleus and epinucleus.  The remaining cortex was then removed using the irrigation and aspiration handpiece. Provisc was then placed into the capsular bag to distend it for lens placement.  A lens was then injected into the capsular bag.  The remaining viscoelastic was aspirated.   Wounds were hydrated with balanced salt  solution.  The anterior chamber was inflated to a physiologic pressure with balanced salt solution.  No wound leaks were noted. Cefuroxime 0.1 ml of a 10mg /ml solution was injected into the anterior chamber for a dose of 1 mg of intracameral antibiotic at the completion of the case.   Timolol and Brimonidine drops were applied to the eye.  The patient was taken to the recovery room in stable condition without complications of anesthesia or surgery.  [name] 07/07/2017, 9:33 AM

## 2017-07-07 NOTE — Anesthesia Postprocedure Evaluation (Signed)
Anesthesia Post Note  Patient: Zachary [name]  Procedure(s) Performed: CATARACT EXTRACTION PHACO AND INTRAOCULAR LENS PLACEMENT ([place]) (Left Eye)  Patient location during evaluation: PACU Anesthesia Type: MAC Level of consciousness: awake and alert Pain management: pain level controlled Vital Signs Assessment: post-procedure vital signs reviewed and stable Respiratory status: spontaneous breathing, nonlabored ventilation, respiratory function stable and patient connected to nasal cannula oxygen Cardiovascular status: stable and blood pressure returned to baseline Postop Assessment: no apparent nausea or vomiting Anesthetic complications: no    Sonni Barse

## 2017-07-08 ENCOUNTER — Encounter: Payer: Self-pay | Admitting: Ophthalmology

## 2017-07-17 ENCOUNTER — Other Ambulatory Visit: Payer: Self-pay

## 2017-07-17 ENCOUNTER — Encounter: Payer: Self-pay | Admitting: *Deleted

## 2017-07-24 NOTE — Discharge Instructions (Signed)

## 2017-07-28 ENCOUNTER — Encounter
Admission: RE | Disposition: A | Discharge status: Home / Self Care | Payer: Self-pay | Source: Ambulatory Visit | Attending: Ophthalmology

## 2017-07-28 ENCOUNTER — Ambulatory Visit
Admission: RE | Admit: 2017-07-28 | Discharge: 2017-07-28 | Disposition: A | Discharge status: Home / Self Care | Payer: Medicare Other | Source: Ambulatory Visit | Attending: Ophthalmology | Admitting: Ophthalmology

## 2017-07-28 ENCOUNTER — Ambulatory Visit: Payer: Medicare Other | Admitting: Anesthesiology

## 2017-07-28 DIAGNOSIS — Z79899 Other long term (current) drug therapy: Secondary | ICD-10-CM | POA: Diagnosis not present

## 2017-07-28 DIAGNOSIS — Z87891 Personal history of nicotine dependence: Secondary | ICD-10-CM | POA: Insufficient documentation

## 2017-07-28 DIAGNOSIS — J449 Chronic obstructive pulmonary disease, unspecified: Secondary | ICD-10-CM | POA: Insufficient documentation

## 2017-07-28 DIAGNOSIS — G4730 Sleep apnea, unspecified: Secondary | ICD-10-CM | POA: Insufficient documentation

## 2017-07-28 DIAGNOSIS — H2511 Age-related nuclear cataract, right eye: Secondary | ICD-10-CM | POA: Diagnosis present

## 2017-07-28 DIAGNOSIS — Z7982 Long term (current) use of aspirin: Secondary | ICD-10-CM | POA: Insufficient documentation

## 2017-07-28 DIAGNOSIS — Z951 Presence of aortocoronary bypass graft: Secondary | ICD-10-CM | POA: Insufficient documentation

## 2017-07-28 DIAGNOSIS — E7800 Pure hypercholesterolemia, unspecified: Secondary | ICD-10-CM | POA: Insufficient documentation

## 2017-07-28 DIAGNOSIS — I2510 Atherosclerotic heart disease of native coronary artery without angina pectoris: Secondary | ICD-10-CM | POA: Diagnosis not present

## 2017-07-28 DIAGNOSIS — Z85820 Personal history of malignant melanoma of skin: Secondary | ICD-10-CM | POA: Diagnosis not present

## 2017-07-28 DIAGNOSIS — I10 Essential (primary) hypertension: Secondary | ICD-10-CM | POA: Diagnosis not present

## 2017-07-28 DIAGNOSIS — Z882 Allergy status to sulfonamides status: Secondary | ICD-10-CM | POA: Insufficient documentation

## 2017-07-28 HISTORY — PX: CATARACT EXTRACTION W/PHACO: SHX586

## 2017-07-28 SURGERY — PHACOEMULSIFICATION, CATARACT, WITH IOL INSERTION
Anesthesia: Monitor Anesthesia Care | Site: Eye | Laterality: Right | Wound class: Clean

## 2017-07-28 MED ORDER — FENTANYL CITRATE (PF) 100 MCG/2ML IJ SOLN
INTRAMUSCULAR | Status: DC | PRN
  Administered 2017-07-28: 50 ug via INTRAVENOUS

## 2017-07-28 MED ORDER — LIDOCAINE HCL (PF) 2 % IJ SOLN
INTRAOCULAR | Status: DC | PRN
  Administered 2017-07-28: 1 mL via INTRAMUSCULAR

## 2017-07-28 MED ORDER — CEFUROXIME OPHTHALMIC INJECTION 1 MG/0.1 ML
INJECTION | OPHTHALMIC | Status: DC | PRN
  Administered 2017-07-28: .3 mL via OPHTHALMIC

## 2017-07-28 MED ORDER — ARMC OPHTHALMIC DILATING DROPS
1.0000 "application " | OPHTHALMIC | Status: DC | PRN
  Administered 2017-07-28 (×3): 1 via OPHTHALMIC

## 2017-07-28 MED ORDER — NA HYALUR & NA CHOND-NA HYALUR 0.4-0.35 ML IO KIT
PACK | INTRAOCULAR | Status: DC | PRN
  Administered 2017-07-28: 1 mL via INTRAOCULAR

## 2017-07-28 MED ORDER — EPINEPHRINE PF 1 MG/ML IJ SOLN
INTRAOCULAR | Status: DC | PRN
  Administered 2017-07-28: 60 mL via OPHTHALMIC

## 2017-07-28 MED ORDER — BRIMONIDINE TARTRATE-TIMOLOL 0.2-0.5 % OP SOLN
OPHTHALMIC | Status: DC | PRN
  Administered 2017-07-28: 1 [drp] via OPHTHALMIC

## 2017-07-28 MED ORDER — MOXIFLOXACIN HCL 0.5 % OP SOLN
1.0000 [drp] | OPHTHALMIC | Status: DC | PRN
  Administered 2017-07-28 (×3): 1 [drp] via OPHTHALMIC

## 2017-07-28 MED ORDER — LACTATED RINGERS IV SOLN: 10.0000 mL/h | INTRAVENOUS | Status: DC

## 2017-07-28 MED ORDER — MIDAZOLAM HCL 2 MG/2ML IJ SOLN
INTRAMUSCULAR | Status: DC | PRN
  Administered 2017-07-28: 2 mg via INTRAVENOUS

## 2017-07-28 SURGICAL SUPPLY — 27 items
CANNULA ANT/CHMB 27G (MISCELLANEOUS) ×1 IMPLANT
CANNULA ANT/CHMB 27GA (MISCELLANEOUS) ×3 IMPLANT
CARTRIDGE ABBOTT (MISCELLANEOUS) IMPLANT
GLOVE SURG LX 7.5 STRW (GLOVE) ×2
GLOVE SURG LX STRL 7.5 STRW (GLOVE) ×1 IMPLANT
GLOVE SURG TRIUMPH 8.0 PF LTX (GLOVE) ×3 IMPLANT
GOWN STRL REUS W/ TWL LRG LVL3 (GOWN DISPOSABLE) ×2 IMPLANT
GOWN STRL REUS W/TWL LRG LVL3 (GOWN DISPOSABLE) ×6
LENS IOL TECNIS ITEC 20.0 (Intraocular Lens) ×2 IMPLANT
MARKER SKIN DUAL TIP RULER LAB (MISCELLANEOUS) ×3 IMPLANT
NDL FILTER BLUNT 18X1 1/2 (NEEDLE) ×1 IMPLANT
NDL RETROBULBAR .5 NSTRL (NEEDLE) IMPLANT
NEEDLE FILTER BLUNT 18X 1/2SAF (NEEDLE) ×2
NEEDLE FILTER BLUNT 18X1 1/2 (NEEDLE) ×1 IMPLANT
PACK CATARACT BRASINGTON (MISCELLANEOUS) ×3 IMPLANT
PACK EYE AFTER SURG (MISCELLANEOUS) ×3 IMPLANT
PACK OPTHALMIC (MISCELLANEOUS) ×3 IMPLANT
RING MALYGIN 7.0 (MISCELLANEOUS) IMPLANT
SUT ETHILON 10-0 CS-B-6CS-B-6 (SUTURE)
SUT VICRYL  9 0 (SUTURE)
SUT VICRYL 9 0 (SUTURE) IMPLANT
SUTURE EHLN 10-0 CS-B-6CS-B-6 (SUTURE) IMPLANT
SYR 3ML LL SCALE MARK (SYRINGE) ×3 IMPLANT
SYR 5ML LL (SYRINGE) ×3 IMPLANT
SYR TB 1ML LUER SLIP (SYRINGE) ×3 IMPLANT
WATER STERILE IRR 500ML POUR (IV SOLUTION) ×3 IMPLANT
WIPE NON LINTING 3.25X3.25 (MISCELLANEOUS) ×3 IMPLANT

## 2017-07-28 NOTE — Anesthesia Procedure Notes (Signed)
Procedure Name: MAC Date/Time: 07/28/2017 9:00 AM Performed by: Janna Arch, CRNA Pre-anesthesia Checklist: Patient identified, Emergency Drugs available and Suction available Patient Re-evaluated:Patient Re-evaluated prior to induction Oxygen Delivery Method: Nasal cannula

## 2017-07-28 NOTE — Transfer of Care (Signed)
Immediate Anesthesia Transfer of Care Note  Patient: Zachary [name]  Procedure(s) Performed: CATARACT EXTRACTION PHACO AND INTRAOCULAR LENS PLACEMENT ([facility]) RIGHT (Right Eye)  Patient Location: PACU  Anesthesia Type: MAC  Level of Consciousness: awake, alert  and patient cooperative  Airway and Oxygen Therapy: Patient Spontanous Breathing and Patient connected to supplemental oxygen  Post-op Assessment: Post-op Vital signs reviewed, Patient's Cardiovascular Status Stable, Respiratory Function Stable, Patent Airway and No signs of Nausea or vomiting  Post-op Vital Signs: Reviewed and stable  Complications: No apparent anesthesia complications

## 2017-07-28 NOTE — H&P (Signed)
The History and Physical notes are on paper, have been signed, and are to be scanned. The patient remains stable and unchanged from the H&P.   Previous H&P reviewed, patient examined, and there are no changes.  Zachary [name] 07/28/2017 8:36 AM

## 2017-07-28 NOTE — Anesthesia Postprocedure Evaluation (Signed)
Anesthesia Post Note  Patient: Zachary [name]  Procedure(s) Performed: CATARACT EXTRACTION PHACO AND INTRAOCULAR LENS PLACEMENT ([place]) RIGHT (Right Eye)  Patient location during evaluation: PACU Anesthesia Type: MAC Level of consciousness: awake and alert Pain management: pain level controlled Vital Signs Assessment: post-procedure vital signs reviewed and stable Respiratory status: spontaneous breathing, nonlabored ventilation, respiratory function stable and patient connected to nasal cannula oxygen Cardiovascular status: stable and blood pressure returned to baseline Postop Assessment: no apparent nausea or vomiting Anesthetic complications: no    Alisa Graff

## 2017-07-28 NOTE — Op Note (Signed)
LOCATION:  [place]   PREOPERATIVE DIAGNOSIS:    Nuclear sclerotic cataract right eye. H25.11   POSTOPERATIVE DIAGNOSIS:  Nuclear sclerotic cataract right eye.     PROCEDURE:  Phacoemusification with posterior chamber intraocular lens placement of the right eye   LENS:   Implant Name Type Inv. Item Serial No. Manufacturer Lot No. LRB No. Used  LENS IOL DIOP 20.0 - [id number] Intraocular Lens LENS IOL DIOP 20.0 [id number] AMO  Right 1        ULTRASOUND TIME: 18 % of 1 minutes, 9 seconds.  CDE 12.3   SURGEON:  Wyonia Hough, MD   ANESTHESIA:  Topical with tetracaine drops and 2% Xylocaine jelly, augmented with 1% preservative-free intracameral lidocaine.    COMPLICATIONS:  None.   DESCRIPTION OF PROCEDURE:  The patient was identified in the holding room and transported to the operating room and placed in the supine position under the operating microscope.  The right eye was identified as the operative eye and it was prepped and draped in the usual sterile ophthalmic fashion.   A 1 millimeter clear-corneal paracentesis was made at the 12:00 position.  0.5 ml of preservative-free 1% lidocaine was injected into the anterior chamber. The anterior chamber was filled with Viscoat viscoelastic.  A 2.4 millimeter keratome was used to make a near-clear corneal incision at the 9:00 position.  A curvilinear capsulorrhexis was made with a cystotome and capsulorrhexis forceps.  Balanced salt solution was used to hydrodissect and hydrodelineate the nucleus.   Phacoemulsification was then used in stop and chop fashion to remove the lens nucleus and epinucleus.  The remaining cortex was then removed using the irrigation and aspiration handpiece. Provisc was then placed into the capsular bag to distend it for lens placement.  A lens was then injected into the capsular bag.  The remaining viscoelastic was aspirated.   Wounds were hydrated with balanced salt solution.  The anterior  chamber was inflated to a physiologic pressure with balanced salt solution.  No wound leaks were noted. Cefuroxime 0.1 ml of a 10mg /ml solution was injected into the anterior chamber for a dose of 1 mg of intracameral antibiotic at the completion of the case.   Timolol and Brimonidine drops were applied to the eye.  The patient was taken to the recovery room in stable condition without complications of anesthesia or surgery.   Zachary [name] 07/28/2017, 9:17 AM

## 2017-07-28 NOTE — Anesthesia Preprocedure Evaluation (Signed)
Anesthesia Evaluation  Patient identified by MRN, date of birth, ID band  Reviewed: NPO status   History of Anesthesia Complications Negative for: history of anesthetic complications  Airway Mallampati: II  TM Distance: >3 FB Neck ROM: full    Dental no notable dental hx.    Pulmonary neg pulmonary ROS, sleep apnea (no cpap) , COPD,  COPD inhaler, former smoker,  Chronic cough   Pulmonary exam normal        Cardiovascular Exercise Tolerance: Good hypertension, + CAD and + CABG (2007)  Normal cardiovascular exam   cards stable: 05/2017: dr. Saralyn Pilar;   CABG x5, LIMA to LAD, SVG to D1, PDA, SVG to ramus / OM1, 08/21/2005   ETT sestamibi study 05/09/2014 revealed LVEF of 66% with moderate inferior scar without ischemia. 2D echocardiogram on 05/09/2014 revealed normal left ventricular function with LVEF 50-55%, mild to moderate tricuspid regurgitation and mild mitral regurgitation.      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), No Residual Symptoms negative psych ROS   GI/Hepatic negative GI ROS, Neg liver ROS,   Endo/Other  negative endocrine ROSMorbid obesity (bmi=32)  Renal/GU negative Renal ROS  negative genitourinary   Musculoskeletal   Abdominal   Peds  Hematology melanoma   Anesthesia Other Findings   Reproductive/Obstetrics                             Anesthesia Physical  Anesthesia Plan  ASA: III  Anesthesia Plan: MAC   Post-op Pain Management:    Induction:   PONV Risk Score and Plan:   Airway Management Planned:   Additional Equipment:   Intra-op Plan:   Post-operative Plan:   Informed Consent: I have reviewed the patients History and Physical, chart, labs and discussed the procedure including the risks, benefits and alternatives for the proposed anesthesia with the patient or authorized representative who has indicated his/her understanding and  acceptance.     Plan Discussed with: CRNA  Anesthesia Plan Comments:         Anesthesia Quick Evaluation

## 2017-07-29 ENCOUNTER — Encounter: Payer: Self-pay | Admitting: Ophthalmology

## 2021-04-13 ENCOUNTER — Other Ambulatory Visit: Payer: Self-pay | Admitting: Gerontology

## 2021-04-13 DIAGNOSIS — R633 Feeding difficulties, unspecified: Secondary | ICD-10-CM

## 2021-04-13 DIAGNOSIS — R0989 Other specified symptoms and signs involving the circulatory and respiratory systems: Secondary | ICD-10-CM

## 2021-04-13 DIAGNOSIS — R1319 Other dysphagia: Secondary | ICD-10-CM

## 2021-04-13 DIAGNOSIS — J441 Chronic obstructive pulmonary disease with (acute) exacerbation: Secondary | ICD-10-CM

## 2021-04-13 DIAGNOSIS — Y844 Aspiration of fluid as the cause of abnormal reaction of the patient, or of later complication, without mention of misadventure at the time of the procedure: Secondary | ICD-10-CM

## 2021-04-13 DIAGNOSIS — R051 Acute cough: Secondary | ICD-10-CM

## 2021-04-16 ENCOUNTER — Ambulatory Visit
Admission: RE | Admit: 2021-04-16 | Discharge: 2021-04-16 | Disposition: A | Discharge status: Home / Self Care | Payer: Medicare Other | Source: Ambulatory Visit | Attending: Gerontology | Admitting: Gerontology

## 2021-04-16 DIAGNOSIS — R1314 Dysphagia, pharyngoesophageal phase: Secondary | ICD-10-CM | POA: Insufficient documentation

## 2021-04-16 DIAGNOSIS — R1319 Other dysphagia: Secondary | ICD-10-CM | POA: Diagnosis present

## 2021-04-16 DIAGNOSIS — J328 Other chronic sinusitis: Secondary | ICD-10-CM | POA: Diagnosis not present

## 2021-04-16 DIAGNOSIS — Y844 Aspiration of fluid as the cause of abnormal reaction of the patient, or of later complication, without mention of misadventure at the time of the procedure: Secondary | ICD-10-CM | POA: Insufficient documentation

## 2021-04-16 DIAGNOSIS — R051 Acute cough: Secondary | ICD-10-CM | POA: Insufficient documentation

## 2021-04-16 DIAGNOSIS — R1313 Dysphagia, pharyngeal phase: Secondary | ICD-10-CM | POA: Insufficient documentation

## 2021-04-16 DIAGNOSIS — Z8701 Personal history of pneumonia (recurrent): Secondary | ICD-10-CM | POA: Insufficient documentation

## 2021-04-16 DIAGNOSIS — R633 Feeding difficulties, unspecified: Secondary | ICD-10-CM | POA: Diagnosis not present

## 2021-04-16 DIAGNOSIS — I2510 Atherosclerotic heart disease of native coronary artery without angina pectoris: Secondary | ICD-10-CM | POA: Insufficient documentation

## 2021-04-16 DIAGNOSIS — R0989 Other specified symptoms and signs involving the circulatory and respiratory systems: Secondary | ICD-10-CM | POA: Diagnosis present

## 2021-04-16 DIAGNOSIS — Z8673 Personal history of transient ischemic attack (TIA), and cerebral infarction without residual deficits: Secondary | ICD-10-CM | POA: Insufficient documentation

## 2021-04-16 DIAGNOSIS — J441 Chronic obstructive pulmonary disease with (acute) exacerbation: Secondary | ICD-10-CM | POA: Insufficient documentation

## 2021-04-16 DIAGNOSIS — I10 Essential (primary) hypertension: Secondary | ICD-10-CM | POA: Diagnosis not present

## 2021-04-16 NOTE — Therapy (Signed)
Zachary [name], [place], [address] Phone: [phone]   Fax:     Modified Barium Swallow  Patient Details  Name: Zachary [name] MRN: [id number] Date of Birth: 1945/09/16 No data recorded  Encounter Date: 04/16/2021   End of Session - 04/16/21 1735     Visit Number 1    Number of Visits 1    Date for SLP Re-Evaluation 04/16/21    SLP Start Time 3235    SLP Stop Time  5732    SLP Time Calculation (min) 30 min    Activity Tolerance Patient tolerated treatment well              There were no vitals filed for this visit.   Subjective Assessment - 04/16/21 1325     Subjective Reports rice, crunchy foods get stuck in mucuous and he coughs them up.             Objective Swallowing Evaluation: Type of Study: MBS-Modified Barium Swallow Study   Patient Details  Name: Zachary [name] MRN: [id number] Date of Birth: 12/03/1945  Today's Date: 04/16/2021 Time: SLP Start Time (ACUTE ONLY): 2376 -SLP Stop Time (ACUTE ONLY): 2831  SLP Time Calculation (min) (ACUTE ONLY): 30 min   Past Medical History:  Past Medical History:  Diagnosis Date   Asthma    CHF (congestive heart failure) (HCC)    no CPAP   Chronic cough    COPD (chronic obstructive pulmonary disease) ([place])    Coronary artery disease    Dyspnea    High cholesterol    Hypertension    Melanoma ([place])    Orthopnea    sleeps in a chair   Seasonal allergies    Stroke [facility])    Jan 2007   Past Surgical History:  Past Surgical History:  Procedure Laterality Date   CARDIAC SURGERY     CABG x 5  2007   CATARACT EXTRACTION W/PHACO Left 07/07/2017   Procedure: CATARACT EXTRACTION PHACO AND INTRAOCULAR LENS PLACEMENT ([place]);  [name]: Zachary Koyanagi, MD;  Location: [facility];  Service: Ophthalmology;  Laterality: Left;  IVA TOPICAL LEFT   CATARACT EXTRACTION W/PHACO Right 07/28/2017   Procedure: CATARACT EXTRACTION  PHACO AND INTRAOCULAR LENS PLACEMENT ([place]) RIGHT;  [name]: Zachary Koyanagi, MD;  Location: [facility];  Service: Ophthalmology;  Laterality: Right;   CORONARY ARTERY BYPASS GRAFT     x 5 in 2007   NASAL SINUS SURGERY     UVULECTOMY     HPI: Patient is a 76 y.o. male with past medical history including COPD, recurrent pneumonia (twice in last 6 months), CAD, CVA (2007), HTN, chronic sinusitis (s/p sinus surgery 1999), and asthma. Uvulectomy is also documented. Patient reports thick mucuous which he coughs up during meals; he feels when he swallows crunchy foods or rice that particles become stuck in the mucuous in his throat and he coughs these up. He denies prior history of swallowing difficulties.   Subjective: alert, cooperative, pleasant mood   Assessment / Plan / Recommendation  Clinical Impressions 04/16/2021  Clinical Impression Patient presents with mild structural pharyngeal and pharyngoesophageal dysphagia without aspiration observed on this examination. Oral stage is within normal limits, characterized by adequate bolus hold and oral control of the bolus, timely anterior to posterior transit and adequate oral clearance. Swallow initiation is timely at the level of the base of tongue. Question appearance of uneven tissue along the posterior pharyngeal wall of  the oropharynx. Pt also noted with fullness of submandibular tissue to palpation. Pharyngeal phase is impacted by forward head/kyphotic posture which impedes epiglottic deflection especially with thinner/lighter boluses. This results in intermittent transient laryngeal penetration of thin and nectar-thick barium during the swallow due to reduced laryngeal closure, as well as mild-moderate residue in the valleculae. Bony protrusions noted C5-6 consistent with appearance of osteophytes; noted mild retention and intermittent retrograde flow from this region, with mild residue just above the pharyngoesophageal segment, along the  posterior pharyngeal wall, and in pyriform sinuses. Reduces with liquid wash, or with subsequent swallow. View of the cervical esophagus was limited by pt's shoulder. Approximately 2 minutes after the exam, pt had coughing episode which was productive of greenish yellow secretions; no barium or food particles were present in pt's secretions. Consider ENT consult for direct viewing of pharynx/larynx. Recommend pt continue current diet (regular, thin liquids); advised pt continue to avoid troublesome foods such as popcorn, and consider moistening dry foods, adding sauce/gravy to rice to form a more cohesive bolus. Recommend alternating solids and liquids, with extra dry swallow to facilitate pharyngeal clearance.  SLP Visit Diagnosis Dysphagia, pharyngeal phase (R13.13);Dysphagia, pharyngoesophageal phase (R13.14)  Impact on safety and function Mild aspiration risk      Treatment Recommendations 04/16/2021  Treatment Recommendations No treatment recommended at this time     No flowsheet data found.  Diet Recommendations 04/16/2021  SLP Diet Recommendations Regular solids;Thin liquid  Liquid Administration via Cup;Straw  Medication Administration Whole meds with liquid  Compensations Slow rate;Small sips/bites;Follow solids with liquid;Multiple dry swallows after each bite/sip  Postural Changes Remain semi-upright after after feeds/meals (Comment);Seated upright at 90 degrees      Other Recommendations 04/16/2021  Recommended Consults Consider ENT evaluation  Oral Care Recommendations Oral care BID  Other Recommendations --  Follow Up Recommendations No SLP follow up  Assistance recommended at discharge --  Functional Status Assessment --    No flowsheet data found.    Oral Phase 04/16/2021  Oral Phase WFL  Oral - Pudding Teaspoon --  Oral - Pudding Cup --  Oral - Honey Teaspoon --  Oral - Honey Cup --  Oral - Nectar Teaspoon --  Oral - Nectar Cup --  Oral - Nectar Straw --  Oral -  Thin Teaspoon --  Oral - Thin Cup --  Oral - Thin Straw --  Oral - Puree --  Oral - Mech Soft --  Oral - Regular --  Oral - Multi-Consistency --  Oral - Pill --  Oral Phase - Comment --    Pharyngeal Phase 04/16/2021  Pharyngeal Phase Impaired  Pharyngeal- Pudding Teaspoon --  Pharyngeal --  Pharyngeal- Pudding Cup --  Pharyngeal --  Pharyngeal- Honey Teaspoon --  Pharyngeal --  Pharyngeal- Honey Cup --  Pharyngeal --  Pharyngeal- Nectar Teaspoon Reduced epiglottic inversion;Pharyngeal residue - valleculae;Pharyngeal residue - pyriform;Pharyngeal residue - cp segment  Pharyngeal Material does not enter airway  Pharyngeal- Nectar Cup Reduced epiglottic inversion;Pharyngeal residue - valleculae;Pharyngeal residue - pyriform;Pharyngeal residue - posterior pharnyx;Pharyngeal residue - cp segment;Penetration/Aspiration during swallow  Pharyngeal Material enters airway, remains ABOVE vocal cords then ejected out  Pharyngeal- Nectar Straw --  Pharyngeal --  Pharyngeal- Thin Teaspoon Reduced epiglottic inversion;Pharyngeal residue - valleculae;Pharyngeal residue - cp segment;Pharyngeal residue - pyriform  Pharyngeal Material does not enter airway  Pharyngeal- Thin Cup Reduced epiglottic inversion;Penetration/Aspiration during swallow;Pharyngeal residue - valleculae;Pharyngeal residue - pyriform;Pharyngeal residue - cp segment  Pharyngeal Material enters airway, remains ABOVE vocal  cords then ejected out  Pharyngeal- Thin Straw --  Pharyngeal --  Pharyngeal- Puree Pharyngeal residue - valleculae;Pharyngeal residue - cp segment  Pharyngeal Material does not enter airway  Pharyngeal- Mechanical Soft Pharyngeal residue - valleculae;Pharyngeal residue - cp segment  Pharyngeal Material does not enter airway  Pharyngeal- Regular --  Pharyngeal --  Pharyngeal- Multi-consistency --  Pharyngeal --  Pharyngeal- Pill --  Pharyngeal --  Pharyngeal Comment --     Cervical Esophageal Phase   04/16/2021  Cervical Esophageal Phase Impaired  Pudding Teaspoon --  Pudding Cup --  Honey Teaspoon --  Honey Cup --  Nectar Teaspoon --  Nectar Cup --  Nectar Straw --  Thin Teaspoon --  Thin Cup --  Thin Straw --  Puree --  Mechanical Soft --  Regular --  Multi-consistency --  Pill --  Cervical Esophageal Comment --     Aliene Altes 04/16/2021, 5:36 PM        Other dysphagia - Plan: DG SWALLOW FUNC OP MEDICARE SPEECH PATH, DG SWALLOW FUNC OP MEDICARE SPEECH PATH  Acute cough - Plan: DG SWALLOW FUNC OP MEDICARE SPEECH PATH, DG SWALLOW FUNC OP MEDICARE SPEECH PATH  Rhonchi - Plan: DG SWALLOW FUNC OP MEDICARE SPEECH PATH, DG SWALLOW FUNC OP MEDICARE SPEECH PATH  Aspiration of fluid causing abnormal reaction or later complication - Plan: DG SWALLOW FUNC OP MEDICARE SPEECH PATH, DG SWALLOW FUNC OP MEDICARE SPEECH PATH  COPD with exacerbation ([place]) - Plan: DG SWALLOW FUNC OP MEDICARE SPEECH PATH, DG SWALLOW FUNC OP MEDICARE SPEECH PATH  Feeding difficulties - Plan: DG SWALLOW FUNC OP MEDICARE SPEECH PATH, DG SWALLOW FUNC OP MEDICARE SPEECH PATH        Problem List Patient Active Problem List   Diagnosis Date Noted   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

## 2021-09-16 ENCOUNTER — Emergency Department: Payer: Medicare Other

## 2021-09-16 ENCOUNTER — Other Ambulatory Visit: Payer: Self-pay

## 2021-09-16 ENCOUNTER — Inpatient Hospital Stay
Admission: EM | Admit: 2021-09-16 | Discharge: 2021-09-18 | DRG: 191 | Disposition: A | Discharge status: Home / Self Care | Payer: Medicare Other | Attending: Internal Medicine | Admitting: Internal Medicine

## 2021-09-16 DIAGNOSIS — Z888 Allergy status to other drugs, medicaments and biological substances status: Secondary | ICD-10-CM

## 2021-09-16 DIAGNOSIS — I2510 Atherosclerotic heart disease of native coronary artery without angina pectoris: Secondary | ICD-10-CM | POA: Diagnosis present

## 2021-09-16 DIAGNOSIS — Z8249 Family history of ischemic heart disease and other diseases of the circulatory system: Secondary | ICD-10-CM

## 2021-09-16 DIAGNOSIS — Z882 Allergy status to sulfonamides status: Secondary | ICD-10-CM

## 2021-09-16 DIAGNOSIS — Z9842 Cataract extraction status, left eye: Secondary | ICD-10-CM

## 2021-09-16 DIAGNOSIS — Z951 Presence of aortocoronary bypass graft: Secondary | ICD-10-CM

## 2021-09-16 DIAGNOSIS — Z961 Presence of intraocular lens: Secondary | ICD-10-CM | POA: Diagnosis present

## 2021-09-16 DIAGNOSIS — Z85820 Personal history of malignant melanoma of skin: Secondary | ICD-10-CM

## 2021-09-16 DIAGNOSIS — Z7951 Long term (current) use of inhaled steroids: Secondary | ICD-10-CM

## 2021-09-16 DIAGNOSIS — Z7982 Long term (current) use of aspirin: Secondary | ICD-10-CM

## 2021-09-16 DIAGNOSIS — J302 Other seasonal allergic rhinitis: Secondary | ICD-10-CM | POA: Diagnosis present

## 2021-09-16 DIAGNOSIS — Z9841 Cataract extraction status, right eye: Secondary | ICD-10-CM

## 2021-09-16 DIAGNOSIS — Z8673 Personal history of transient ischemic attack (TIA), and cerebral infarction without residual deficits: Secondary | ICD-10-CM

## 2021-09-16 DIAGNOSIS — J441 Chronic obstructive pulmonary disease with (acute) exacerbation: Principal | ICD-10-CM | POA: Diagnosis present

## 2021-09-16 DIAGNOSIS — E7800 Pure hypercholesterolemia, unspecified: Secondary | ICD-10-CM | POA: Diagnosis present

## 2021-09-16 DIAGNOSIS — Z23 Encounter for immunization: Secondary | ICD-10-CM

## 2021-09-16 DIAGNOSIS — R531 Weakness: Secondary | ICD-10-CM | POA: Diagnosis not present

## 2021-09-16 DIAGNOSIS — Z79899 Other long term (current) drug therapy: Secondary | ICD-10-CM

## 2021-09-16 DIAGNOSIS — Z20822 Contact with and (suspected) exposure to covid-19: Secondary | ICD-10-CM | POA: Diagnosis present

## 2021-09-16 DIAGNOSIS — J209 Acute bronchitis, unspecified: Secondary | ICD-10-CM | POA: Diagnosis present

## 2021-09-16 DIAGNOSIS — J440 Chronic obstructive pulmonary disease with acute lower respiratory infection: Secondary | ICD-10-CM | POA: Diagnosis present

## 2021-09-16 DIAGNOSIS — Z91010 Allergy to peanuts: Secondary | ICD-10-CM

## 2021-09-16 DIAGNOSIS — R32 Unspecified urinary incontinence: Secondary | ICD-10-CM | POA: Diagnosis present

## 2021-09-16 DIAGNOSIS — R6510 Systemic inflammatory response syndrome (SIRS) of non-infectious origin without acute organ dysfunction: Secondary | ICD-10-CM | POA: Diagnosis present

## 2021-09-16 DIAGNOSIS — I10 Essential (primary) hypertension: Secondary | ICD-10-CM | POA: Diagnosis present

## 2021-09-16 LAB — COMPREHENSIVE METABOLIC PANEL
ALT: 17 U/L (ref 0–44)
AST: 17 U/L (ref 15–41)
Albumin: 3.9 g/dL (ref 3.5–5.0)
Alkaline Phosphatase: 74 U/L (ref 38–126)
Anion gap: 8 (ref 5–15)
BUN: 15 mg/dL (ref 8–23)
CO2: 21 mmol/L — ABNORMAL LOW (ref 22–32)
Calcium: 9.9 mg/dL (ref 8.9–10.3)
Chloride: 106 mmol/L (ref 98–111)
Creatinine, Ser: 1.11 mg/dL (ref 0.61–1.24)
GFR, Estimated: >60 mL/min (ref >60)
Glucose, Bld: 123 mg/dL — ABNORMAL HIGH (ref 70–99)
Potassium: 4 mmol/L (ref 3.5–5.1)
Sodium: 135 mmol/L (ref 135–145)
Total Bilirubin: 0.8 mg/dL (ref 0.3–1.2)
Total Protein: 9.3 g/dL — ABNORMAL HIGH (ref 6.5–8.1)

## 2021-09-16 LAB — URINALYSIS, ROUTINE W REFLEX MICROSCOPIC
Bacteria, UA: NONE SEEN
Bilirubin Urine: NEGATIVE
Glucose, UA: NEGATIVE mg/dL
Hgb urine dipstick: NEGATIVE
Ketones, ur: NEGATIVE mg/dL
Leukocytes,Ua: NEGATIVE
Nitrite: NEGATIVE
Protein, ur: 30 mg/dL — AB
Specific Gravity, Urine: 1.02 (ref 1.005–1.030)
pH: 5 (ref 5.0–8.0)

## 2021-09-16 LAB — CBC WITH DIFFERENTIAL/PLATELET
Abs Immature Granulocytes: 0.08 10*3/uL — ABNORMAL HIGH (ref 0.00–0.07)
Basophils Absolute: 0.1 10*3/uL (ref 0.0–0.1)
Basophils Relative: 0 %
Eosinophils Absolute: 0.1 10*3/uL (ref 0.0–0.5)
Eosinophils Relative: 0 %
HCT: 45.4 % (ref 39.0–52.0)
Hemoglobin: 14.6 g/dL (ref 13.0–17.0)
Immature Granulocytes: 1 %
Lymphocytes Relative: 8 %
Lymphs Abs: 1.5 10*3/uL (ref 0.7–4.0)
MCH: 29.6 pg (ref 26.0–34.0)
MCHC: 32.2 g/dL (ref 30.0–36.0)
MCV: 92.1 fL (ref 80.0–100.0)
Monocytes Absolute: 1.5 10*3/uL — ABNORMAL HIGH (ref 0.1–1.0)
Monocytes Relative: 9 %
Neutro Abs: 14.5 10*3/uL — ABNORMAL HIGH (ref 1.7–7.7)
Neutrophils Relative %: 82 %
Platelets: 266 10*3/uL (ref 150–400)
RBC: 4.93 MIL/uL (ref 4.22–5.81)
RDW: 13.7 % (ref 11.5–15.5)
WBC: 17.7 10*3/uL — ABNORMAL HIGH (ref 4.0–10.5)
nRBC: 0 % (ref 0.0–0.2)

## 2021-09-16 LAB — TROPONIN I (HIGH SENSITIVITY): Troponin I (High Sensitivity): 42 ng/L — ABNORMAL HIGH (ref <18)

## 2021-09-16 MED ORDER — SODIUM CHLORIDE 0.9 % IV BOLUS
1000.0000 mL | Freq: Once | INTRAVENOUS | Status: AC
  Administered 2021-09-17: 1000 mL via INTRAVENOUS

## 2021-09-16 MED ORDER — VANCOMYCIN HCL 2000 MG/400ML IV SOLN
2000.0000 mg | Freq: Once | INTRAVENOUS | Status: AC
  Administered 2021-09-17: 2000 mg via INTRAVENOUS
  Filled 2021-09-16: qty 400

## 2021-09-16 MED ORDER — METRONIDAZOLE 500 MG/100ML IV SOLN
500.0000 mg | Freq: Once | INTRAVENOUS | Status: AC
  Administered 2021-09-17: 500 mg via INTRAVENOUS
  Filled 2021-09-16: qty 100

## 2021-09-16 MED ORDER — VANCOMYCIN HCL IN DEXTROSE 1-5 GM/200ML-% IV SOLN: 1000.0000 mg | Freq: Once | INTRAVENOUS | Status: DC

## 2021-09-16 MED ORDER — SODIUM CHLORIDE 0.9 % IV SOLN
2.0000 g | Freq: Once | INTRAVENOUS | Status: AC
  Administered 2021-09-17: 2 g via INTRAVENOUS
  Filled 2021-09-16: qty 12.5

## 2021-09-16 NOTE — ED Provider Notes (Signed)
[facility] Provider Note    Event Date/Time   First MD Initiated Contact with Patient 09/16/21 2047     (approximate)  History   Chief Complaint: Weakness  HPI  Zachary [name] is a 76 y.o. male with a past medical history of asthma, CHF, COPD, hypertension, presents to the emergency department for weakness.  According to the patient and the wife since Thursday he has been experiencing intermittent episodes of weakness.  He states at times he will be so weak that he cannot get out of the chair or off the toilet.  This happened intermittently over the weekend, today it has been much more severe.  Wife states 3 different times today the patient could not get off the chair or off the toilet.  She also states patient has been having incontinence episodes which the patient has had in the past but much worse today.  No reported dysuria.  States he felt subjective fever today but was afebrile when he measured his temperature.  Patient is currently 99.8 in the emergency department.  Patient states a cough but states this has been ongoing since November.  Patient recently had a skin flap procedure to his upper back for melanoma however they also state a recent PET CT scan that was otherwise negative.  Physical Exam   Triage Vital Signs: ED Triage Vitals  Enc Vitals Group     BP 09/16/21 2026 (!) 142/80     Pulse Rate 09/16/21 2026 (!) 55     Resp 09/16/21 2026 18     Temp 09/16/21 2026 99.8 F (37.7 C)     Temp Source 09/16/21 2026 Oral     SpO2 09/16/21 2026 93 %     Weight 09/16/21 2026 190 lb (86.2 kg)     Height 09/16/21 2026 '5\' 10"'$  (1.778 m)     Head Circumference --      Peak Flow --      Pain Score 09/16/21 2040 0     Pain Loc --      Pain Edu? --      Excl. in [place]? --     Most recent vital signs: Vitals:   09/16/21 2026  BP: (!) 142/80  Pulse: (!) 55  Resp: 18  Temp: 99.8 F (37.7 C)  SpO2: 93%    General: Awake, no distress.  CV:  Good  peripheral perfusion.  Regular rate and rhythm  Resp:  Normal effort.  Equal breath sounds bilaterally.  Abd:  No distention.  Soft, nontender.  No rebound or guarding. Other:  Equal grip strength bilaterally.  5/5 strength in bilateral upper and lower extremities.  No pronator drift.   ED Results / Procedures / Treatments   EKG  EKG viewed and interpreted by myself shows sinus tachycardia 125 bpm with a narrow QRS, normal axis, normal intervals, nonspecific but no concerning ST changes.  RADIOLOGY  I have interpreted the CT images of the head, no bleed seen on my evaluation. Radiology is read the CT scan is negative. Chest x-ray shows emphysematous changes but no consolidation.  MEDICATIONS ORDERED IN ED: Medications - No data to display   IMPRESSION / MDM / [place] / ED COURSE  I reviewed the triage vital signs and the nursing notes.  Patient's presentation is most consistent with acute presentation with potential threat to life or bodily function.  Patient presents to the emergency department for weakness.  Patient was unable to get out of  a chair off the toilet several times today due to weakness.  They also noted the patient has been more incontinent today than typical.  Patient found to be tachycardic around 125, borderline temperature 99.8.  Given the patient's weakness we will check labs including urinalysis, we will IV hydrate obtain CT imaging of the head as well as a chest x-ray.  Differential would include infectious etiology such as pneumonia, UTI, metabolic or electrolyte abnormality such as renal insufficiency, CVA   CT scan of the head is negative, chest x-ray is normal.  Patient's troponin is mildly elevated at 42, chemistry is normal, CBC shows leukocytosis.  Given the patient's leukocytosis increased respiratory rate borderline hypoxia borderline temperature and tachycardia.  Patient meets sepsis criteria.  We will start the patient on broad-spectrum  antibiotics, IV hydrate.  Urinalysis does not appear to show any acute abnormality.  We will admit to the hospitalist for further work-up and treatment.  CRITICAL CARE Performed by: Harvest Dark   Total critical care time: 30 minutes  Critical care time was exclusive of separately billable procedures and treating other patients.  Critical care was necessary to treat or prevent imminent or life-threatening deterioration.  Critical care was time spent personally by me on the following activities: development of treatment plan with patient and/or surrogate as well as nursing, discussions with consultants, evaluation of patient's response to treatment, examination of patient, obtaining history from patient or surrogate, ordering and performing treatments and interventions, ordering and review of laboratory studies, ordering and review of radiographic studies, pulse oximetry and re-evaluation of patient's condition.   FINAL CLINICAL IMPRESSION(S) / ED DIAGNOSES   Weakness Sepsis  Note:  This document was prepared using Dragon voice recognition software and may include unintentional dictation errors.   Harvest Dark, MD 09/16/21 2314

## 2021-09-16 NOTE — Progress Notes (Signed)
CODE SEPSIS - PHARMACY COMMUNICATION  **Broad Spectrum Antibiotics should be administered within 1 hour of Sepsis diagnosis**  Time Code Sepsis Called/Page Received: 2320  Antibiotics Ordered: Cefepime and Vancomycin  Time of 1st antibiotic administration: 0046  If necessary, Name of Provider/Nurse Contacted: Judson Roch, RN.  Pt in ED for 3 hr prior to Code Sepsis called.  RN not made aware of code sepsis and was busy taking care of an ICU pt.  Started 1st abx as soon as she became aware of Code.  Renda Rolls, PharmD, [facility] 09/16/2021 11:27 PM

## 2021-09-16 NOTE — ED Notes (Signed)
Pts HR varies from 31-126.  Dr notified.

## 2021-09-16 NOTE — Progress Notes (Signed)
PHARMACY -  BRIEF ANTIBIOTIC NOTE   Pharmacy has received consult(s) for Cefepime and Vancomycin from an ED provider.  The patient's profile has been reviewed for ht/wt/allergies/indication/available labs.    One time order(s) placed for Cefepime 2 gm & Vancomycin 2 gm per pt wt: 86.2 kg  Further antibiotics/pharmacy consults should be ordered by admitting physician if indicated.                       Thank you, Renda Rolls, PharmD, [facility] 09/16/2021 11:23 PM

## 2021-09-16 NOTE — ED Triage Notes (Signed)
To triage via wheelchair with weakness since Thursday. Sx resolved and then began again. Reports when this happens he is unable to stand at all. Denies chest pain, denies shortness of breath. Denies all other sx besides leg weakness. Pt then reports he was having chills at home, but no measurable fever despite feeling warm. Pt wife reports urinary incontinence has been much worse since Thursday. During triage, EKG shows widely varying HR ranging from mid 30s to 120s.

## 2021-09-17 ENCOUNTER — Observation Stay: Payer: Medicare Other

## 2021-09-17 ENCOUNTER — Encounter: Payer: Self-pay | Admitting: Internal Medicine

## 2021-09-17 DIAGNOSIS — J209 Acute bronchitis, unspecified: Secondary | ICD-10-CM | POA: Diagnosis present

## 2021-09-17 DIAGNOSIS — Z951 Presence of aortocoronary bypass graft: Secondary | ICD-10-CM | POA: Diagnosis not present

## 2021-09-17 DIAGNOSIS — Z23 Encounter for immunization: Secondary | ICD-10-CM | POA: Diagnosis present

## 2021-09-17 DIAGNOSIS — R6510 Systemic inflammatory response syndrome (SIRS) of non-infectious origin without acute organ dysfunction: Secondary | ICD-10-CM | POA: Diagnosis present

## 2021-09-17 DIAGNOSIS — Z85820 Personal history of malignant melanoma of skin: Secondary | ICD-10-CM | POA: Diagnosis not present

## 2021-09-17 DIAGNOSIS — Z91010 Allergy to peanuts: Secondary | ICD-10-CM | POA: Diagnosis not present

## 2021-09-17 DIAGNOSIS — E7800 Pure hypercholesterolemia, unspecified: Secondary | ICD-10-CM | POA: Diagnosis present

## 2021-09-17 DIAGNOSIS — Z9841 Cataract extraction status, right eye: Secondary | ICD-10-CM | POA: Diagnosis not present

## 2021-09-17 DIAGNOSIS — Z8673 Personal history of transient ischemic attack (TIA), and cerebral infarction without residual deficits: Secondary | ICD-10-CM | POA: Diagnosis not present

## 2021-09-17 DIAGNOSIS — J441 Chronic obstructive pulmonary disease with (acute) exacerbation: Secondary | ICD-10-CM | POA: Diagnosis present

## 2021-09-17 DIAGNOSIS — Z888 Allergy status to other drugs, medicaments and biological substances status: Secondary | ICD-10-CM | POA: Diagnosis not present

## 2021-09-17 DIAGNOSIS — R32 Unspecified urinary incontinence: Secondary | ICD-10-CM | POA: Diagnosis present

## 2021-09-17 DIAGNOSIS — R531 Weakness: Secondary | ICD-10-CM | POA: Diagnosis present

## 2021-09-17 DIAGNOSIS — Z961 Presence of intraocular lens: Secondary | ICD-10-CM | POA: Diagnosis present

## 2021-09-17 DIAGNOSIS — Z7951 Long term (current) use of inhaled steroids: Secondary | ICD-10-CM | POA: Diagnosis not present

## 2021-09-17 DIAGNOSIS — Z882 Allergy status to sulfonamides status: Secondary | ICD-10-CM | POA: Diagnosis not present

## 2021-09-17 DIAGNOSIS — Z8249 Family history of ischemic heart disease and other diseases of the circulatory system: Secondary | ICD-10-CM | POA: Diagnosis not present

## 2021-09-17 DIAGNOSIS — I2510 Atherosclerotic heart disease of native coronary artery without angina pectoris: Secondary | ICD-10-CM | POA: Diagnosis present

## 2021-09-17 DIAGNOSIS — Z7982 Long term (current) use of aspirin: Secondary | ICD-10-CM | POA: Diagnosis not present

## 2021-09-17 DIAGNOSIS — J440 Chronic obstructive pulmonary disease with acute lower respiratory infection: Secondary | ICD-10-CM | POA: Diagnosis present

## 2021-09-17 DIAGNOSIS — J302 Other seasonal allergic rhinitis: Secondary | ICD-10-CM | POA: Diagnosis present

## 2021-09-17 DIAGNOSIS — Z9842 Cataract extraction status, left eye: Secondary | ICD-10-CM | POA: Diagnosis not present

## 2021-09-17 DIAGNOSIS — Z79899 Other long term (current) drug therapy: Secondary | ICD-10-CM | POA: Diagnosis not present

## 2021-09-17 DIAGNOSIS — I10 Essential (primary) hypertension: Secondary | ICD-10-CM | POA: Diagnosis present

## 2021-09-17 DIAGNOSIS — Z20822 Contact with and (suspected) exposure to covid-19: Secondary | ICD-10-CM | POA: Diagnosis present

## 2021-09-17 LAB — CBC WITH DIFFERENTIAL/PLATELET
Abs Immature Granulocytes: 0.06 10*3/uL (ref 0.00–0.07)
Basophils Absolute: 0.1 10*3/uL (ref 0.0–0.1)
Basophils Relative: 0 %
Eosinophils Absolute: 0.1 10*3/uL (ref 0.0–0.5)
Eosinophils Relative: 1 %
HCT: 40.8 % (ref 39.0–52.0)
Hemoglobin: 13 g/dL (ref 13.0–17.0)
Immature Granulocytes: 0 %
Lymphocytes Relative: 10 %
Lymphs Abs: 1.4 10*3/uL (ref 0.7–4.0)
MCH: 29.3 pg (ref 26.0–34.0)
MCHC: 31.9 g/dL (ref 30.0–36.0)
MCV: 92.1 fL (ref 80.0–100.0)
Monocytes Absolute: 1.4 10*3/uL — ABNORMAL HIGH (ref 0.1–1.0)
Monocytes Relative: 10 %
Neutro Abs: 10.7 10*3/uL — ABNORMAL HIGH (ref 1.7–7.7)
Neutrophils Relative %: 79 %
Platelets: 191 10*3/uL (ref 150–400)
RBC: 4.43 MIL/uL (ref 4.22–5.81)
RDW: 14 % (ref 11.5–15.5)
WBC: 13.7 10*3/uL — ABNORMAL HIGH (ref 4.0–10.5)
nRBC: 0 % (ref 0.0–0.2)

## 2021-09-17 LAB — RESP PANEL BY RT-PCR (FLU A&B, COVID) ARPGX2
Influenza A by PCR: NEGATIVE
Influenza B by PCR: NEGATIVE
SARS Coronavirus 2 by RT PCR: NEGATIVE

## 2021-09-17 LAB — TSH: TSH: 1.268 u[IU]/mL (ref 0.350–4.500)

## 2021-09-17 LAB — BASIC METABOLIC PANEL
Anion gap: 7 (ref 5–15)
BUN: 13 mg/dL (ref 8–23)
CO2: 21 mmol/L — ABNORMAL LOW (ref 22–32)
Calcium: 8.8 mg/dL — ABNORMAL LOW (ref 8.9–10.3)
Chloride: 107 mmol/L (ref 98–111)
Creatinine, Ser: 0.87 mg/dL (ref 0.61–1.24)
GFR, Estimated: >60 mL/min (ref >60)
Glucose, Bld: 117 mg/dL — ABNORMAL HIGH (ref 70–99)
Potassium: 3.6 mmol/L (ref 3.5–5.1)
Sodium: 135 mmol/L (ref 135–145)

## 2021-09-17 LAB — CK: Total CK: 40 U/L — ABNORMAL LOW (ref 49–397)

## 2021-09-17 LAB — TROPONIN I (HIGH SENSITIVITY): Troponin I (High Sensitivity): 42 ng/L — ABNORMAL HIGH (ref <18)

## 2021-09-17 LAB — LACTIC ACID, PLASMA: Lactic Acid, Venous: 1 mmol/L (ref 0.5–1.9)

## 2021-09-17 MED ORDER — MOMETASONE FURO-FORMOTEROL FUM 200-5 MCG/ACT IN AERO
2.0000 | INHALATION_SPRAY | Freq: Two times a day (BID) | RESPIRATORY_TRACT | Status: DC
  Administered 2021-09-18: 2 via RESPIRATORY_TRACT
  Filled 2021-09-17: qty 8.8

## 2021-09-17 MED ORDER — UMECLIDINIUM BROMIDE 62.5 MCG/ACT IN AEPB
1.0000 | INHALATION_SPRAY | Freq: Every day | RESPIRATORY_TRACT | Status: DC
  Administered 2021-09-18: 1 via RESPIRATORY_TRACT
  Filled 2021-09-17: qty 7

## 2021-09-17 MED ORDER — ISOSORBIDE MONONITRATE ER 30 MG PO TB24
30.0000 mg | ORAL_TABLET | Freq: Every day | ORAL | Status: DC
  Administered 2021-09-17 (×2): 30 mg via ORAL
  Filled 2021-09-17 (×2): qty 1

## 2021-09-17 MED ORDER — ROSUVASTATIN CALCIUM 5 MG PO TABS
2.5000 mg | ORAL_TABLET | Freq: Every day | ORAL | Status: DC
  Administered 2021-09-17 (×2): 2.5 mg via ORAL
  Filled 2021-09-17 (×2): qty 0.5

## 2021-09-17 MED ORDER — MOMETASONE FURO-FORMOTEROL FUM 200-5 MCG/ACT IN AERO: 2.0000 | INHALATION_SPRAY | Freq: Two times a day (BID) | RESPIRATORY_TRACT | Status: DC

## 2021-09-17 MED ORDER — ENOXAPARIN SODIUM 40 MG/0.4ML IJ SOSY
40.0000 mg | PREFILLED_SYRINGE | INTRAMUSCULAR | Status: DC
  Filled 2021-09-17 (×2): qty 0.4

## 2021-09-17 MED ORDER — METHYLPREDNISOLONE SODIUM SUCC 40 MG IJ SOLR
40.0000 mg | Freq: Two times a day (BID) | INTRAMUSCULAR | Status: DC
  Administered 2021-09-17 – 2021-09-18 (×3): 40 mg via INTRAVENOUS
  Filled 2021-09-17 (×3): qty 1

## 2021-09-17 MED ORDER — ACETAMINOPHEN 325 MG PO TABS: 650.0000 mg | ORAL_TABLET | Freq: Four times a day (QID) | ORAL | Status: DC | PRN

## 2021-09-17 MED ORDER — SODIUM CHLORIDE 0.9 % IV SOLN
100.0000 mg | Freq: Two times a day (BID) | INTRAVENOUS | Status: DC
  Administered 2021-09-17 – 2021-09-18 (×3): 100 mg via INTRAVENOUS
  Filled 2021-09-17 (×5): qty 100

## 2021-09-17 MED ORDER — UMECLIDINIUM BROMIDE 62.5 MCG/ACT IN AEPB
1.0000 | INHALATION_SPRAY | Freq: Every day | RESPIRATORY_TRACT | Status: DC
  Filled 2021-09-17: qty 7

## 2021-09-17 MED ORDER — VANCOMYCIN HCL 1750 MG/350ML IV SOLN: 1750.0000 mg | INTRAVENOUS | Status: DC

## 2021-09-17 MED ORDER — LACTATED RINGERS IV SOLN: INTRAVENOUS | Status: DC

## 2021-09-17 MED ORDER — DIPHENHYDRAMINE-APAP (SLEEP) 25-500 MG PO TABS: 2.0000 | ORAL_TABLET | Freq: Every day | ORAL | Status: DC

## 2021-09-17 MED ORDER — ASPIRIN 81 MG PO TBEC
81.0000 mg | DELAYED_RELEASE_TABLET | Freq: Every day | ORAL | Status: DC
  Administered 2021-09-17 – 2021-09-18 (×2): 81 mg via ORAL
  Filled 2021-09-17 (×2): qty 1

## 2021-09-17 MED ORDER — LORAZEPAM 2 MG/ML IJ SOLN
0.2500 mg | Freq: Once | INTRAMUSCULAR | Status: AC
  Administered 2021-09-17: 0.25 mg via INTRAVENOUS
  Filled 2021-09-17: qty 1

## 2021-09-17 MED ORDER — MOMETASONE FURO-FORMOTEROL FUM 200-5 MCG/ACT IN AERO
2.0000 | INHALATION_SPRAY | Freq: Two times a day (BID) | RESPIRATORY_TRACT | Status: DC
  Filled 2021-09-17: qty 8.8

## 2021-09-17 MED ORDER — LACTATED RINGERS IV SOLN: INTRAVENOUS | Status: AC

## 2021-09-17 MED ORDER — PNEUMOCOCCAL 20-VAL CONJ VACC 0.5 ML IM SUSY
0.5000 mL | PREFILLED_SYRINGE | INTRAMUSCULAR | Status: AC
  Administered 2021-09-18: 0.5 mL via INTRAMUSCULAR
  Filled 2021-09-17 (×3): qty 0.5

## 2021-09-17 MED ORDER — ALBUTEROL SULFATE (2.5 MG/3ML) 0.083% IN NEBU
2.5000 mg | INHALATION_SOLUTION | RESPIRATORY_TRACT | Status: DC | PRN
Start: 2021-09-17 — End: 2021-09-18
  Administered 2021-09-17: 2.5 mg via RESPIRATORY_TRACT
  Filled 2021-09-17: qty 3

## 2021-09-17 MED ORDER — ALBUTEROL SULFATE HFA 108 (90 BASE) MCG/ACT IN AERS
2.0000 | INHALATION_SPRAY | Freq: Four times a day (QID) | RESPIRATORY_TRACT | Status: DC | PRN
Start: 2021-09-17 — End: 2021-09-17

## 2021-09-17 MED ORDER — TIOTROPIUM BROMIDE MONOHYDRATE 18 MCG IN CAPS: 18.0000 ug | ORAL_CAPSULE | Freq: Every day | RESPIRATORY_TRACT | Status: DC

## 2021-09-17 MED ORDER — FLUTICASONE PROPIONATE 50 MCG/ACT NA SUSP
1.0000 | Freq: Every day | NASAL | Status: DC
Start: 2021-09-17 — End: 2021-09-17

## 2021-09-17 MED ORDER — TIOTROPIUM BROMIDE MONOHYDRATE 18 MCG IN CAPS
18.0000 ug | ORAL_CAPSULE | Freq: Every day | RESPIRATORY_TRACT | Status: DC
Start: 2021-09-17 — End: 2021-09-17

## 2021-09-17 MED ORDER — SODIUM CHLORIDE 0.9 % IV SOLN: 2.0000 g | Freq: Two times a day (BID) | INTRAVENOUS | Status: DC

## 2021-09-17 MED ORDER — METOPROLOL TARTRATE 50 MG PO TABS
50.0000 mg | ORAL_TABLET | Freq: Two times a day (BID) | ORAL | Status: DC
  Administered 2021-09-17 – 2021-09-18 (×4): 50 mg via ORAL
  Filled 2021-09-17 (×4): qty 1

## 2021-09-17 MED ORDER — STERILE WATER FOR INJECTION IJ SOLN
INTRAMUSCULAR | Status: AC
  Administered 2021-09-17: 10 mL
  Filled 2021-09-17: qty 10

## 2021-09-17 MED ORDER — ACETAMINOPHEN 650 MG RE SUPP: 650.0000 mg | Freq: Four times a day (QID) | RECTAL | Status: DC | PRN

## 2021-09-17 NOTE — Progress Notes (Signed)
[facility] PROGRESS NOTE    Progress Note  BEAUMONT A[place]AD  HOZ:[id number] DOB: [birth year] DOA: 09/16/2021 PCP: Sofie Hartigan, MD     Brief Narrative:   Zachary [name] is an 76 y.o. 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  In the ER was found to be tachypneic with leukocytosis CT of the head unremarkable UA and chest x-ray showed were unremarkable.  Due to concerns of sepsis was started empirically on antibiotics and fluid resuscitated.  Assessment/Plan:   SIRS (systemic inflammatory response syndrome) (HCC) possibly due to COPD exacerbation: Start empirically on IV antibiotics vancomycin Flagyl and cefepime. We will go ahead and de-escalate to Doxy .  We will go ahead and start him on steroids. Questional possible COPD exacerbation, poor air movement. Due to his symptoms is likely an lower respiratory infection. Chest x-ray was clean, remained afebrile so he could be a COPD exacerbation. Has remained afebrile leukocytosis improving. Consult physical therapy. Ambulate and check saturations with ambulation.  Generalized weakness: Also complaining of right hand and low back pain. CK 40, cardiac biomarkers have basically remained flat. MRI showed multilevel, what appears to be chronic changes.  L4 compression with no marrow edema suggestive of chronic changes L1-L2 mild disc protrusion, L3-L4 mild foraminal stenosis and anterolisthesis of L4-L5 which is subtle Physical therapy  Essential hypertension: Continue current medication Imdur metoprolol blood pressure is well controlled.  CAD: Continue aspirin and statins.  Recent melanoma surgery of the upper back: Noted.    DVT  prophylaxis: lovenox Family Communication:none Status is: Observation The patient remains OBS appropriate and will d/c before 2 midnights.    Code Status:     Code Status Orders  (From admission, onward)           Start     Ordered   09/17/21 0200  Full code  Continuous        09/17/21 0200           Code Status History     Date Active Date Inactive Code Status Order ID Comments User Context   10/24/2016 2049 10/25/2016 1553 Full Code [id number]  Baxter Hire, MD ED   05/05/2015 2009 05/07/2015 1355 Full Code [id number]  Demetrios Loll, MD Inpatient         IV Access:   Peripheral IV   Procedures and diagnostic studies:   MR LUMBAR SPINE WO CONTRAST  Result Date: 09/17/2021 CLINICAL DATA:  76 year old male with low back pain and weakness. EXAM: MRI LUMBAR SPINE WITHOUT CONTRAST TECHNIQUE: Multiplanar, multisequence MR imaging of the lumbar spine was performed. No intravenous contrast was administered. COMPARISON:  Abdominal radiographs 08/31/2005. FINDINGS: Segmentation:  Normal on the comparison. Alignment: Mildly exaggerated lumbar lordosis. Subtle anterolisthesis of L4 on L5. Similar mild retrolisthesis of L5 on S1. Vertebrae: L4 mild superior endplate compression and central superior endplate Schmorl's node (series 7, image 9), but no significant No marrow edema or evidence of acute osseous abnormality. Normal background bone marrow signal. No other No acute osseous abnormality identified. Visible sacrum and SI joints appear intact. Conus medullaris and cauda equina: Conus extends to the L1 level. No lower spinal cord or conus signal abnormality. Paraspinal and other soft tissues: Negative. Disc levels: T11-T12: Partially  visible, grossly negative. T12-L1:  Negative. L1-L2: Small left paracentral and cephalad disc extrusion (series 8, image 10) with estimated 6 x 12 mm small sequestered disc fragment posterior to L1. Superimposed mild facet hypertrophy at the disc space  level and minor disc bulging. No significant stenosis. L2-L3: Disc desiccation with mild circumferential disc bulge. Mild to moderate facet and ligament flavum hypertrophy. No significant stenosis. L3-L4: Disc desiccation and disc space loss. Circumferential disc bulge and endplate spurring eccentric to the left. Mild to moderate facet and ligament flavum hypertrophy. Mild spinal stenosis. Up to mild lateral recess stenosis (L4 nerve levels) and bilateral L3 foraminal stenosis. L4-L5: Subtle anterolisthesis. Circumferential disc bulge. Mild to moderate facet and ligament flavum hypertrophy. Degenerative facet joint fluid. Mild bilateral lateral recess stenosis without spinal stenosis (L5 nerve levels). No foraminal stenosis. L5-S1: Mild retrolisthesis. Disc space loss. Circumferential disc osteophyte complex. Mild facet hypertrophy. No stenosis. IMPRESSION: 1. Mild L4 superior endplate compression and Schmorl's node with no significant marrow edema, suggesting late subacute or chronic time course. 2. Small left paracentral and cephalad disc extrusion at L1-L2 with a small sequestered disc fragment posterior to L1. No associated stenosis. 3. But there is mild multifactorial spinal stenosis at L3-L4, with up to mild foraminal and lateral recess stenosis (L3 and L4 nerve levels). 4. Subtle anterolisthesis at both L4-L5 and L5-S1 with disc and posterior element degeneration. Mild lateral recess stenosis at the bilateral L5 nerve levels. Electronically Signed   By: Genevie Ann M.D.   On: 09/17/2021 05:44   DG Chest Portable 1 View  Result Date: 09/16/2021 CLINICAL DATA:  Shortness of breath increasing since Thursday. EXAM: PORTABLE CHEST 1 VIEW COMPARISON:  05/05/2015 FINDINGS: Postoperative changes in the mediastinum. Heart size and pulmonary vascularity are normal. No airspace disease or consolidation in the lungs. Probable emphysematous changes in the upper lungs. No pleural effusions. No pneumothorax. Calcification  of the aorta. Degenerative changes in the spine and shoulders. IMPRESSION: Emphysematous changes in the lungs.  No focal consolidation. Electronically Signed   By: Lucienne Capers M.D.   On: 09/16/2021 21:37   CT HEAD WO CONTRAST (5MM)  Result Date: 09/16/2021 CLINICAL DATA:  Acute neurological deficit with stroke suspected. Weakness since Thursday. EXAM: CT HEAD WITHOUT CONTRAST TECHNIQUE: Contiguous axial images were obtained from the base of the skull through the vertex without intravenous contrast. RADIATION DOSE REDUCTION: This exam was performed according to the departmental dose-optimization program which includes automated exposure control, adjustment of the mA and/or kV according to patient size and/or use of iterative reconstruction technique. COMPARISON:  CT head 05/23/2005.  MRI brain 04/12/2005 FINDINGS: Brain: Mild cerebral atrophy. No ventricular dilatation. Low-attenuation changes in the deep white matter consistent with small vessel ischemia. Focal area of encephalomalacia in the posterior right parietal lobe likely represents an old infarct. Old infarct again demonstrated in the left thalamus without change. No mass-effect or midline shift. No abnormal extra-axial fluid collections. Gray-white matter junctions are mostly distinct. Basal cisterns are not effaced. No acute intracranial hemorrhage. Vascular: Moderate intracranial arterial vascular calcifications. Skull: Normal. Negative for fracture or focal lesion. Sinuses/Orbits: Mucosal thickening in the paranasal sinuses. Mastoid air cells are clear. Other: None. IMPRESSION: 1. No acute intracranial abnormalities. 2. Chronic atrophy and small vessel ischemic changes. 3. Probable old infarct in the right posterior frontal region. Electronically Signed   By: Lucienne Capers M.D.   On: 09/16/2021 21:25     Medical Consultants:   None.   Subjective:    Zachary [name] has no pain or unilateral weakness  Objective:    Vitals:    09/17/21 0030 09/17/21 0100 09/17/21 0130 09/17/21 0320  BP: (!) 149/83 (!) 146/80 (!) 148/82 (!) 148/87  Pulse: 98 96 (!) 104 89  Resp: (!) '22 14 13   '$ Temp:   98.7 F (37.1 C)   TempSrc:   Oral   SpO2: 94% 93% 93%   Weight:      Height:       SpO2: 93 %   Intake/Output Summary (Last 24 hours) at 09/17/2021 0710 Last data filed at 09/17/2021 0125 Gross per 24 hour  Intake 98.35 ml  Output --  Net 98.35 ml   Filed Weights   09/16/21 2026 09/16/21 2040  Weight: 86.2 kg 86.2 kg    Exam: General exam: In no acute distress. Respiratory system: Poor air movement and clear to auscultation. Cardiovascular system: S1 & S2 heard, RRR. No JVD. Gastrointestinal system: Abdomen is nondistended, soft and nontender.  Extremities: No pedal edema. Skin: No rashes, lesions or ulcers Psychiatry: Judgement and insight appear normal. Mood & affect appropriate.    Data Reviewed:    Labs: Basic Metabolic Panel: Recent Labs  Lab 09/16/21 2027 09/17/21 0548  NA 135 135  K 4.0 3.6  CL 106 107  CO2 21* 21*  GLUCOSE 123* 117*  BUN 15 13  CREATININE 1.11 0.87  CALCIUM 9.9 8.8*   GFR Estimated Creatinine Clearance: 75.8 mL/min (by C-G formula based on SCr of 0.87 mg/dL). Liver Function Tests: Recent Labs  Lab 09/16/21 2027  AST 17  ALT 17  ALKPHOS 74  BILITOT 0.8  PROT 9.3*  ALBUMIN 3.9   No results for input(s): "LIPASE", "AMYLASE" in the last 168 hours. No results for input(s): "AMMONIA" in the last 168 hours. Coagulation profile No results for input(s): "INR", "PROTIME" in the last 168 hours. COVID-19 Labs  No results for input(s): "DDIMER", "FERRITIN", "LDH", "CRP" in the last 72 hours.  Lab Results  Component Value Date   [place] NEGATIVE 09/17/2021    CBC: Recent Labs  Lab 09/16/21 2027 09/17/21 0548  WBC 17.7* 13.7*  NEUTROABS 14.5* 10.7*  HGB 14.6 13.0  HCT 45.4 40.8  MCV 92.1 92.1  PLT 266 191   Cardiac Enzymes: Recent Labs  Lab  09/17/21 0548  CKTOTAL 40*   BNP (last 3 results) No results for input(s): "PROBNP" in the last 8760 hours. CBG: No results for input(s): "GLUCAP" in the last 168 hours. D-Dimer: No results for input(s): "DDIMER" in the last 72 hours. Hgb A1c: No results for input(s): "HGBA1C" in the last 72 hours. Lipid Profile: No results for input(s): "CHOL", "HDL", "LDLCALC", "TRIG", "CHOLHDL", "LDLDIRECT" in the last 72 hours. Thyroid function studies: Recent Labs    09/17/21 0548  TSH 1.268   Anemia work up: No results for input(s): "VITAMINB12", "FOLATE", "FERRITIN", "TIBC", "IRON", "RETICCTPCT" in the last 72 hours. Sepsis Labs: Recent Labs  Lab 09/16/21 2027 09/17/21 0024 09/17/21 0548  WBC 17.7*  --  13.7*  LATICACIDVEN  --  1.0  --    Microbiology Recent Results (from the past 240 hour(s))  Resp Panel by RT-PCR (Flu A&B, Covid) Anterior Nasal Swab     Status: None   Collection Time: 09/17/21 12:24 AM   Specimen: Anterior Nasal Swab  Result Value Ref Range Status   SARS Coronavirus 2 by RT PCR NEGATIVE NEGATIVE Final    Comment: (NOTE) SARS-CoV-2 target nucleic acids are NOT DETECTED.  The  SARS-CoV-2 RNA is generally detectable in upper respiratory specimens during the acute phase of infection. The lowest concentration of SARS-CoV-2 viral copies this assay can detect is 138 copies/mL. A negative result does not preclude SARS-Cov-2 infection and should not be used as the sole basis for treatment or other patient management decisions. A negative result may occur with  improper specimen collection/handling, submission of specimen other than nasopharyngeal swab, presence of viral mutation(s) within the areas targeted by this assay, and inadequate number of viral copies(<138 copies/mL). A negative result must be combined with clinical observations, patient history, and epidemiological information. The expected result is Negative.  Fact Sheet for Patients:   EntrepreneurPulse.com.au  Fact Sheet for Healthcare Providers:  IncredibleEmployment.be  This test is no t yet approved or cleared by the [place] FDA and  has been authorized for detection and/or diagnosis of SARS-CoV-2 by FDA under an Emergency Use Authorization (EUA). This EUA will remain  in effect (meaning this test can be used) for the duration of the COVID-19 declaration under Section 564(b)(1) of the Act, 21 U.S.C.section 360bbb-3(b)(1), unless the authorization is terminated  or revoked sooner.       Influenza A by PCR NEGATIVE NEGATIVE Final   Influenza B by PCR NEGATIVE NEGATIVE Final    Comment: (NOTE) The Xpert Xpress SARS-CoV-2/FLU/RSV plus assay is intended as an aid in the diagnosis of influenza from Nasopharyngeal swab specimens and should not be used as a sole basis for treatment. Nasal washings and aspirates are unacceptable for Xpert Xpress SARS-CoV-2/FLU/RSV testing.  Fact Sheet for Patients: EntrepreneurPulse.com.au  Fact Sheet for Healthcare Providers: IncredibleEmployment.be  This test is not yet approved or cleared by the [place] FDA and has been authorized for detection and/or diagnosis of SARS-CoV-2 by FDA under an Emergency Use Authorization (EUA). This EUA will remain in effect (meaning this test can be used) for the duration of the COVID-19 declaration under Section 564(b)(1) of the Act, 21 U.S.C. section 360bbb-3(b)(1), unless the authorization is terminated or revoked.  Performed at [facility], [address] [place], [place] [address]      Medications:    aspirin EC  81 mg Oral Daily   enoxaparin (LOVENOX) injection  40 mg Subcutaneous Q24H   isosorbide mononitrate  30 mg Oral QHS   metoprolol tartrate  50 mg Oral BID   [START ON 09/18/2021] mometasone-formoterol  2 puff Inhalation BID   rosuvastatin  2.5 mg Oral QHS   [START ON 09/18/2021]  umeclidinium bromide  1 puff Inhalation Daily   Continuous Infusions:  ceFEPime (MAXIPIME) IV     lactated ringers 100 mL/hr at 09/17/21 0248   [START ON 09/18/2021] vancomycin        LOS: 0 days   Charlynne Cousins  [facility]  09/17/2021, 7:10 AM

## 2021-09-17 NOTE — TOC Initial Note (Addendum)
Transition of Care [facility]) - Initial/Assessment Note    Patient Details  Name: Zachary [name] MRN: [id number] Date of Birth: [birth year]  [facility] CM/SW Contact:    Alberteen Sam, LCSW Phone Number: 09/17/2021, 3:28 PM  Clinical Narrative:                  CSW spoke with patient's spouse Jeani Hawking, offered outpatient PT vs [facility] PT, reports she would like patient to go to outpatient PT in [place] with preference for [place].   CSW spoke with Percell Locus at [phone] they report to fax referral to [phone].   No DME needed patient's spouse reports she has all dme at home.   They go to [facility] in [place] as local pharmacy and [facility] also [facility]. Patient continues to see PCP Dr. Ellison Hughs.   Expected Discharge Plan:  (Outpatient PT) Barriers to Discharge: Continued Medical Work up   Patient Goals and CMS Choice Patient states their goals for this hospitalization and ongoing recovery are:: to go home CMS Medicare.gov Compare Post Acute Care list provided to:: Patient Represenative (must comment) (spouse Jeani Hawking) Choice offered to / list presented to : Spouse  Expected Discharge Plan and Services Expected Discharge Plan:  (Outpatient PT)       Living arrangements for the past 2 months: Single Family Home                                      Prior Living Arrangements/Services Living arrangements for the past 2 months: Single Family Home Lives with:: Spouse                   Activities of Daily Living      Permission Sought/Granted                  Emotional Assessment       Orientation: : Oriented to Self, Oriented to Place, Oriented to  Time, Oriented to Situation Alcohol / Substance Use: Not Applicable Psych Involvement: No (comment)  Admission diagnosis:  SIRS (systemic inflammatory response syndrome) ([place]) [R65.10] COPD with acute bronchitis ([place]) [J44.0,  J20.9] Patient Active Problem List   Diagnosis Date Noted   COPD with acute bronchitis ([place]) 09/17/2021   SIRS (systemic inflammatory response syndrome) ([place]) 09/16/2021   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

## 2021-09-17 NOTE — Progress Notes (Signed)
Pt being followed by [facility] for Sepsis protocol. 

## 2021-09-17 NOTE — H&P (Signed)
History and Physical    Zachary [name] UQJ:[id number] DOB: 12-28-1945 DOA: 09/16/2021  PCP: Sofie Hartigan, MD  Patient coming from: Home.  Chief Complaint: Weakness.  Cough.  HPI: Zachary [name] is a 76 y.o. 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  Since Thursday about 4 days ago patient has been feeling increasing weakness at times incontinence.  Has benign subjective feeling of chills and rigors.  Denies chest pain but has been having productive cough.  Patient states since normal last patient has been having episodes of productive cough which has been treated with antibiotics 4 times during which his symptoms resolved and he feels better.  Since the last week patient has been having productive cough greenish discoloration.  ED Course: In the ER patient was tachycardic tachypneic with the labs showing leukocytosis.  CT head was unremarkable.  UA and chest x-ray does not show any acute.  Given that patient had leukocytosis and tachycardia and tachypnea which is concerning for possible developing sepsis was started on empiric antibiotics.  Review of Systems: As per HPI, rest all negative.   Past Medical History:  Diagnosis Date   Asthma    CHF (congestive heart failure) (HCC)    no CPAP   Chronic cough    COPD (chronic obstructive pulmonary disease) (HCC)    Coronary artery disease    Dyspnea    High cholesterol    Hypertension    Melanoma ([place])    Orthopnea    sleeps in a chair   Seasonal allergies    Stroke [facility])    Jan 2007    Past Surgical History:  Procedure Laterality Date   CARDIAC SURGERY     CABG x 5  2007   CATARACT EXTRACTION W/PHACO Left 07/07/2017   Procedure: CATARACT EXTRACTION PHACO AND INTRAOCULAR LENS PLACEMENT ([place]);  Surgeon: Leandrew Koyanagi, MD;  Location: [place];   Service: Ophthalmology;  Laterality: Left;  IVA TOPICAL LEFT   CATARACT EXTRACTION W/PHACO Right 07/28/2017   Procedure: CATARACT EXTRACTION PHACO AND INTRAOCULAR LENS PLACEMENT ([place]) RIGHT;  Surgeon: Leandrew Koyanagi, MD;  Location: [place];  Service: Ophthalmology;  Laterality: Right;   CORONARY ARTERY BYPASS GRAFT     x 5 in 2007   [place]       reports that he has quit smoking. He has never used smokeless tobacco. He reports that he does not drink alcohol and does not use drugs.  Allergies  Allergen Reactions   Peanuts [Peanut Oil] Anaphylaxis   Atorvastatin Other (See Comments)    Pt states that he had a stroke while taking this medication and was advised by his neurologist not to take it.   Other reaction(s): Unknown   Sulfa Antibiotics Hives and Other (See Comments)   Fluticasone-Salmeterol Rash    Other reaction(s): Other (See Comments)    Family History  Problem Relation Age of Onset   Pancreatic cancer Mother    Hypertension Father    Hypertension Sister     Prior to Admission medications   Medication Sig Start Date End Date Taking? Authorizing Provider  albuterol (PROVENTIL HFA;VENTOLIN HFA) 108 (90 Base) MCG/ACT inhaler Inhale 2 puffs into the lungs every 6 (six) hours as needed for wheezing or shortness of breath. 05/08/15   Theodoro Grist, MD  albuterol (PROVENTIL) (2.5 MG/3ML) 0.083% nebulizer solution Take 3 mLs (2.5 mg total) by nebulization every 4 (four) hours as needed for wheezing or shortness of breath. 05/08/15   Theodoro Grist, MD  aspirin EC 81 MG tablet Take 81 mg by mouth daily.    [provider]  budesonide-formoterol (SYMBICORT) 160-4.5 MCG/ACT inhaler Inhale 2 puffs into the lungs 2 (two) times daily. Patient taking differently: Inhale 2 puffs into the lungs at bedtime.  05/07/15   Theodoro Grist, MD  diphenhydramine-acetaminophen (TYLENOL PM) 25-500 MG TABS tablet Take 2 tablets by mouth at bedtime.     [provider]  fluticasone (FLONASE) 50 MCG/ACT nasal spray Place 1 spray into both nostrils daily. 05/07/15   Theodoro Grist, MD  guaiFENesin (MUCINEX) 600 MG 12 hr tablet Take 1 tablet (600 mg total) by mouth 2 (two) times daily. 05/07/15   Theodoro Grist, MD  isosorbide mononitrate (IMDUR) 30 MG 24 hr tablet Take 30 mg by mouth at bedtime.     [provider]  metoprolol (LOPRESSOR) 50 MG tablet Take 50 mg by mouth 2 (two) times daily.    [provider]  rosuvastatin (CRESTOR) 5 MG tablet Take 2.5 mg by mouth at bedtime.    [provider]  tiotropium (SPIRIVA) 18 MCG inhalation capsule Place 1 capsule (18 mcg total) into inhaler and inhale daily. 05/07/15   Theodoro Grist, MD    Physical Exam: Constitutional: Moderately built and nourished. Vitals:   09/17/21 0000 09/17/21 0030 09/17/21 0100 09/17/21 0130  BP: (!) 146/90 (!) 149/83 (!) 146/80 (!) 148/82  Pulse: 100 98 96 (!) 104  Resp: 15 (!) '22 14 13  '$ Temp:    98.7 F (37.1 C)  TempSrc:    Oral  SpO2: 93% 94% 93% 93%  Weight:      Height:       Eyes: Anicteric no pallor. ENMT: No discharge from the ears eyes nose and mouth. Neck: No mass felt.  No JVD appreciated. Respiratory: No rhonchi or crepitations. Cardiovascular: S1-S2 heard. Abdomen: Soft nontender bowel sound present. Musculoskeletal: No edema. Skin: No rash. Neurologic: Alert awake oriented time place and person.  Moves all extremities. Psychiatric: Appears normal.  Normal affect.   Labs on Admission: I have personally reviewed following labs and imaging studies  CBC: Recent Labs  Lab 09/16/21 2027  WBC 17.7*  NEUTROABS 14.5*  HGB 14.6  HCT 45.4  MCV 92.1  PLT 956   Basic Metabolic Panel: Recent Labs  Lab 09/16/21 2027  NA 135  K 4.0  CL 106  CO2 21*  GLUCOSE 123*  BUN 15  CREATININE 1.11  CALCIUM 9.9   GFR: Estimated Creatinine Clearance: 59.4 mL/min (by C-G formula based on SCr of 1.11 mg/dL). Liver  Function Tests: Recent Labs  Lab 09/16/21 2027  AST 17  ALT 17  ALKPHOS 74  BILITOT 0.8  PROT 9.3*  ALBUMIN 3.9   No results for input(s): "LIPASE", "AMYLASE" in the last 168 hours. No results for input(s): "AMMONIA" in the last 168 hours. Coagulation Profile: No results for input(s): "INR", "PROTIME" in the last 168 hours. Cardiac Enzymes: No results for input(s): "CKTOTAL", "CKMB", "CKMBINDEX", "TROPONINI" in the last 168 hours. BNP (last 3 results) No results for input(s): "PROBNP" in the last 8760 hours. HbA1C: No results for input(s): "HGBA1C" in the last 72 hours. CBG: No results for input(s): "GLUCAP" in the last 168 hours. Lipid Profile: No results for input(s): "CHOL", "HDL", "LDLCALC", "TRIG", "CHOLHDL", "LDLDIRECT" in  the last 72 hours. Thyroid Function Tests: No results for input(s): "TSH", "T4TOTAL", "FREET4", "T3FREE", "THYROIDAB" in the last 72 hours. Anemia Panel: No results for input(s): "VITAMINB12", "FOLATE", "FERRITIN", "TIBC", "IRON", "RETICCTPCT" in the last 72 hours. Urine analysis:    Component Value Date/Time   COLORURINE YELLOW (A) 09/16/2021 2046   APPEARANCEUR CLEAR (A) 09/16/2021 2046   LABSPEC 1.020 09/16/2021 2046   PHURINE 5.0 09/16/2021 2046   GLUCOSEU NEGATIVE 09/16/2021 2046   HGBUR NEGATIVE 09/16/2021 2046   BILIRUBINUR NEGATIVE 09/16/2021 2046   [place] NEGATIVE 09/16/2021 2046   PROTEINUR 30 (A) 09/16/2021 2046   NITRITE NEGATIVE 09/16/2021 2046   LEUKOCYTESUR NEGATIVE 09/16/2021 2046   Sepsis Labs: '@LABRCNTIP'$ (procalcitonin:4,lacticidven:4) ) Recent Results (from the past 240 hour(s))  Resp Panel by RT-PCR (Flu A&B, Covid) Anterior Nasal Swab     Status: None   Collection Time: 09/17/21 12:24 AM   Specimen: Anterior Nasal Swab  Result Value Ref Range Status   SARS Coronavirus 2 by RT PCR NEGATIVE NEGATIVE Final    Comment: (NOTE) SARS-CoV-2 target nucleic acids are NOT DETECTED.  The SARS-CoV-2 RNA is generally detectable  in upper respiratory specimens during the acute phase of infection. The lowest concentration of SARS-CoV-2 viral copies this assay can detect is 138 copies/mL. A negative result does not preclude SARS-Cov-2 infection and should not be used as the sole basis for treatment or other patient management decisions. A negative result may occur with  improper specimen collection/handling, submission of specimen other than nasopharyngeal swab, presence of viral mutation(s) within the areas targeted by this assay, and inadequate number of viral copies(<138 copies/mL). A negative result must be combined with clinical observations, patient history, and epidemiological information. The expected result is Negative.  Fact Sheet for Patients:  EntrepreneurPulse.com.au  Fact Sheet for Healthcare Providers:  IncredibleEmployment.be  This test is no t yet approved or cleared by the [place] FDA and  has been authorized for detection and/or diagnosis of SARS-CoV-2 by FDA under an Emergency Use Authorization (EUA). This EUA will remain  in effect (meaning this test can be used) for the duration of the COVID-19 declaration under Section 564(b)(1) of the Act, 21 U.S.C.section 360bbb-3(b)(1), unless the authorization is terminated  or revoked sooner.       Influenza A by PCR NEGATIVE NEGATIVE Final   Influenza B by PCR NEGATIVE NEGATIVE Final    Comment: (NOTE) The Xpert Xpress SARS-CoV-2/FLU/RSV plus assay is intended as an aid in the diagnosis of influenza from Nasopharyngeal swab specimens and should not be used as a sole basis for treatment. Nasal washings and aspirates are unacceptable for Xpert Xpress SARS-CoV-2/FLU/RSV testing.  Fact Sheet for Patients: EntrepreneurPulse.com.au  Fact Sheet for Healthcare Providers: IncredibleEmployment.be  This test is not yet approved or cleared by the [place] FDA and has  been authorized for detection and/or diagnosis of SARS-CoV-2 by FDA under an Emergency Use Authorization (EUA). This EUA will remain in effect (meaning this test can be used) for the duration of the COVID-19 declaration under Section 564(b)(1) of the Act, 21 U.S.C. section 360bbb-3(b)(1), unless the authorization is terminated or revoked.  Performed at [facility], [address] [place], [place] [address]      Radiological Exams on Admission: DG Chest Portable 1 View  Result Date: 09/16/2021 CLINICAL DATA:  Shortness of breath increasing since Thursday. EXAM: PORTABLE CHEST 1 VIEW COMPARISON:  05/05/2015 FINDINGS: Postoperative changes in the mediastinum. Heart size and pulmonary vascularity are normal. No airspace disease or  consolidation in the lungs. Probable emphysematous changes in the upper lungs. No pleural effusions. No pneumothorax. Calcification of the aorta. Degenerative changes in the spine and shoulders. IMPRESSION: Emphysematous changes in the lungs.  No focal consolidation. Electronically Signed   By: Lucienne Capers M.D.   On: 09/16/2021 21:37   CT HEAD WO CONTRAST (5MM)  Result Date: 09/16/2021 CLINICAL DATA:  Acute neurological deficit with stroke suspected. Weakness since Thursday. EXAM: CT HEAD WITHOUT CONTRAST TECHNIQUE: Contiguous axial images were obtained from the base of the skull through the vertex without intravenous contrast. RADIATION DOSE REDUCTION: This exam was performed according to the departmental dose-optimization program which includes automated exposure control, adjustment of the mA and/or kV according to patient size and/or use of iterative reconstruction technique. COMPARISON:  CT head 05/23/2005.  MRI brain 04/12/2005 FINDINGS: Brain: Mild cerebral atrophy. No ventricular dilatation. Low-attenuation changes in the deep white matter consistent with small vessel ischemia. Focal area of encephalomalacia in the posterior right parietal lobe likely  represents an old infarct. Old infarct again demonstrated in the left thalamus without change. No mass-effect or midline shift. No abnormal extra-axial fluid collections. Gray-white matter junctions are mostly distinct. Basal cisterns are not effaced. No acute intracranial hemorrhage. Vascular: Moderate intracranial arterial vascular calcifications. Skull: Normal. Negative for fracture or focal lesion. Sinuses/Orbits: Mucosal thickening in the paranasal sinuses. Mastoid air cells are clear. Other: None. IMPRESSION: 1. No acute intracranial abnormalities. 2. Chronic atrophy and small vessel ischemic changes. 3. Probable old infarct in the right posterior frontal region. Electronically Signed   By: Lucienne Capers M.D.   On: 09/16/2021 21:25    EKG: Independently reviewed.  Sinus tachycardia.  Assessment/Plan Principal Problem:   SIRS (systemic inflammatory response syndrome) (HCC) Active Problems:   COPD exacerbation (HCC)    SIRS -with leukocytosis tachypnea tachycardia patient meets criteria with SIRS.  Was started on empiric antibiotics follow cultures continue hydration.  Since patient has any productive cough with discolored sputum suspect patient's symptoms may be respiratory. Generalized weakness cause not clear at times patient having incontinence of urine also complains of right hand low back pain.  We will check a lumbar spine MRI also CK levels TSH.  Physical therapy consult. COPD not actively wheezing continue home inhalers. Hypertension we will continue with Imdur and metoprolol. CAD status post CABG on aspirin metoprolol statins. 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  The site of surgery looks well-healed.   DVT prophylaxis: Lovenox. Code Status: Full code. Family Communication: Discussed with patient. Disposition Plan: Home. Consults called: Physical therapy. Admission status: Observation.   Rise Patience MD [facility] Pager [phone].  If  7PM-7AM, please contact night-coverage [email or web address] Password [email or web address]  09/17/2021, 2:01 AM

## 2021-09-17 NOTE — Evaluation (Signed)
Occupational Therapy Evaluation Patient Details Name: Zachary [name] MRN: [id number] DOB: 1945/11/12 Today's Date: 09/17/2021   History of Present Illness Pt is a 76 y.o. male presenting to hospital 6/18 with c/o intermittent episodes of weakness since Thursday (unable to stand from chair or toilet); incontinence episodes (h/o but much worse); chills; productive cough.  Pt admitted with SIRS, generalized weakness, incontinence of urine, R hand and LBP.  PMH includes asthma, CHF, COPD, htn, CABG, chronic cough.  Recent skin flap procedure to upper back for melanoma.   Clinical Impression   Mr Remo was seen for OT evaluation this date. Prior to hospital admission, pt was Independent in all aspects of ADL/IADL, no AD use. Pt lives with his spouse in a 1 story home with 3 steps to enter and 3 dogs. Pt demonstrates near baseline independence to perform ADL and mobility tasks. INDEPENDENT for mobility transfers, assist to manage lines/leads, and for LBD at high stretcher height. Reports fatigue from mobility with PT this AM, decreased activity tolerance for IADL tasks. Educated on ECS. No skilled OT needs identified. Will sign off. Please re-consult if additional OT needs arise.    Recommendations for follow up therapy are one component of a multi-disciplinary discharge planning process, led by the attending physician.  Recommendations may be updated based on patient status, additional functional criteria and insurance authorization.   Follow Up Recommendations  No OT follow up    Assistance Recommended at Discharge Set up Supervision/Assistance  Patient can return home with the following A little help with bathing/dressing/bathroom    Functional Status Assessment  Patient has had a recent decline in their functional status and demonstrates the ability to make significant improvements in function in a reasonable and predictable amount of time.  Equipment Recommendations  None recommended by  OT    Recommendations for Other Services       Precautions / Restrictions Precautions Precautions: None Restrictions Weight Bearing Restrictions: No      Mobility Bed Mobility Overal bed mobility: Modified Independent                  Transfers Overall transfer level: Independent                        Balance Overall balance assessment: Needs assistance Sitting-balance support: No upper extremity supported, Feet supported Sitting balance-Leahy Scale: Normal     Standing balance support: No upper extremity supported, During functional activity Standing balance-Leahy Scale: Good                             ADL either performed or assessed with clinical judgement   ADL Overall ADL's : Modified independent                                       General ADL Comments: SUPERVISION for line mgmt      Pertinent Vitals/Pain Pain Assessment Pain Assessment: No/denies pain     Hand Dominance Right   Extremity/Trunk Assessment Upper Extremity Assessment Upper Extremity Assessment: Overall WFL for tasks assessed   Lower Extremity Assessment Lower Extremity Assessment: Generalized weakness   Cervical / Trunk Assessment Cervical / Trunk Assessment: Normal   Communication Communication Communication: No difficulties   Cognition Arousal/Alertness: Awake/alert Behavior During Therapy: WFL for tasks assessed/performed Overall Cognitive Status: Within Functional Limits for tasks  assessed                                        Home Living Family/patient expects to be discharged to:: Private residence Living Arrangements: Spouse/significant other Available Help at Discharge: Family Type of Home: House Home Access: Stairs to enter Technical brewer of Steps: 2-3 Entrance Stairs-Rails: None Home Layout: One level     Bathroom Shower/Tub: Occupational psychologist:  (comfort height)      Home Equipment: Grab bars - tub/shower;Shower seat - built in;Cane - single point;Rollator (4 wheels);Wheelchair - manual          Prior Functioning/Environment Prior Level of Function : Independent/Modified Independent             Mobility Comments: Active          OT Problem List: Decreased range of motion;Impaired balance (sitting and/or standing);Decreased safety awareness         OT Goals(Current goals can be found in the care plan section) Acute Rehab OT Goals Patient Stated Goal: to go home OT Goal Formulation: With patient Time For Goal Achievement: 10/01/21 Potential to Achieve Goals: Good   AM-PAC OT "6 Clicks" Daily Activity     Outcome Measure Help from another person eating meals?: None Help from another person taking care of personal grooming?: None Help from another person toileting, which includes using toliet, bedpan, or urinal?: None Help from another person bathing (including washing, rinsing, drying)?: A Little Help from another person to put on and taking off regular upper body clothing?: None Help from another person to put on and taking off regular lower body clothing?: A Little 6 Click Score: 22   End of Session    Activity Tolerance: Patient tolerated treatment well Patient left: in bed;with call bell/phone within reach  OT Visit Diagnosis: Unsteadiness on feet (R26.81)                Time: 9518-8416 OT Time Calculation (min): 15 min Charges:  OT General Charges $OT Visit: 1 Visit OT Evaluation $OT Eval Low Complexity: 1 Low  Dessie Coma, M.S. OTR/L  09/17/21, 12:25 PM  ascom [phone]

## 2021-09-17 NOTE — Evaluation (Signed)
Physical Therapy Evaluation Patient Details Name: Zachary [name] MRN: [id number] DOB: 08-10-1945 Today's Date: 09/17/2021  History of Present Illness  Pt is a 76 y.o. male presenting to hospital 6/18 with c/o intermittent episodes of weakness since Thursday (unable to stand from chair or toilet); incontinence episodes (h/o but much worse); chills; productive cough.  Pt admitted with SIRS, generalized weakness, incontinence of urine, R hand and LBP.  PMH includes asthma, CHF, COPD, htn, CABG, chronic cough.  Recent skin flap procedure to upper back for melanoma.  Clinical Impression  Prior to hospital admission, pt was independent and active; lives with his wife in 1 level home with 2-3 STE.  Currently pt is modified independent with bed mobility; independent with transfers; and SBA ambulating 300 feet no AD use.  Pt able to stand independently from multiple different surfaces during session (mild increased effort to stand on own but no UE use).  Pt would benefit from skilled PT to address noted impairments and functional limitations (see below for any additional details).  Upon hospital discharge, pt would benefit from OP PT.    Recommendations for follow up therapy are one component of a multi-disciplinary discharge planning process, led by the attending physician.  Recommendations may be updated based on patient status, additional functional criteria and insurance authorization.  Follow Up Recommendations Outpatient PT    Assistance Recommended at Discharge PRN  Patient can return home with the following  A little help with walking and/or transfers;Assistance with cooking/housework;Help with stairs or ramp for entrance;Assist for transportation    Equipment Recommendations BSC/3in1  Recommendations for Other Services       Functional Status Assessment Patient has had a recent decline in their functional status and demonstrates the ability to make significant improvements in function in a  reasonable and predictable amount of time.     Precautions / Restrictions Restrictions Weight Bearing Restrictions: No      Mobility  Bed Mobility Overal bed mobility: Modified Independent             General bed mobility comments: HOB elevated; no difficulties noted    Transfers Overall transfer level: Independent Equipment used: None               General transfer comment: steady safe transfers from ED stretcher bed, from chair, and from toilet (all without UE use)    Ambulation/Gait Ambulation/Gait assistance: Supervision Gait Distance (Feet): 300 Feet Assistive device: None Gait Pattern/deviations: Step-through pattern Gait velocity: mildly decreased     General Gait Details: steady ambulation  Stairs            Wheelchair Mobility    Modified Rankin (Stroke Patients Only)       Balance Overall balance assessment: Needs assistance Sitting-balance support: No upper extremity supported, Feet supported Sitting balance-Leahy Scale: Normal Sitting balance - Comments: steady sitting reaching outside BOS   Standing balance support: No upper extremity supported, During functional activity Standing balance-Leahy Scale: Good Standing balance comment: no loss of balance with ambulation and head turns R/L/up/down, increasing/decreasing speed, and turning 180 degrees and stopping                             Pertinent Vitals/Pain Pain Assessment Pain Assessment: Faces Faces Pain Scale: No hurt Pain Intervention(s): Limited activity within patient's tolerance, Monitored during session, Repositioned Vitals (HR and O2 on room air) stable and WFL throughout treatment session.    Home Living Family/patient expects  to be discharged to:: Private residence Living Arrangements: Spouse/significant other Available Help at Discharge: Family Type of Home: House Home Access: Stairs to enter Entrance Stairs-Rails: None Entrance Stairs-Number of  Steps: 2-3   Home Layout: One level Home Equipment: Grab bars - tub/shower;Shower seat - built in;Cane - single point;Rollator (4 wheels);Wheelchair - manual      Prior Function Prior Level of Function : Independent/Modified Independent             Mobility Comments: Active       Hand Dominance        Extremity/Trunk Assessment   Upper Extremity Assessment Upper Extremity Assessment: Overall WFL for tasks assessed    Lower Extremity Assessment Lower Extremity Assessment: Generalized weakness    Cervical / Trunk Assessment Cervical / Trunk Assessment: Normal  Communication   Communication: No difficulties  Cognition Arousal/Alertness: Awake/alert Behavior During Therapy: WFL for tasks assessed/performed Overall Cognitive Status: Within Functional Limits for tasks assessed                                          General Comments  Pt agreeable to PT session.    Exercises     Assessment/Plan    PT Assessment Patient needs continued PT services  PT Problem List Decreased strength;Decreased activity tolerance;Decreased balance;Decreased mobility       PT Treatment Interventions Gait training;Stair training;Functional mobility training;Therapeutic activities;Therapeutic exercise;Balance training;Patient/family education    PT Goals (Current goals can be found in the Care Plan section)  Acute Rehab PT Goals Patient Stated Goal: to improve strength PT Goal Formulation: With patient/family Time For Goal Achievement: 10/01/21 Potential to Achieve Goals: Good    Frequency Min 2X/week     Co-evaluation               AM-PAC PT "6 Clicks" Mobility  Outcome Measure Help needed turning from your back to your side while in a flat bed without using bedrails?: None Help needed moving from lying on your back to sitting on the side of a flat bed without using bedrails?: None Help needed moving to and from a bed to a chair (including a  wheelchair)?: None Help needed standing up from a chair using your arms (e.g., wheelchair or bedside chair)?: None Help needed to walk in hospital room?: A Little Help needed climbing 3-5 steps with a railing? : A Little 6 Click Score: 22    End of Session Equipment Utilized During Treatment: Gait belt Activity Tolerance: Patient tolerated treatment well Patient left: in bed;with call bell/phone within reach;with family/visitor present Nurse Communication: Mobility status;Precautions PT Visit Diagnosis: Muscle weakness (generalized) (M62.81);History of falling (Z91.81)    Time: 8366-2947 PT Time Calculation (min) (ACUTE ONLY): 29 min   Charges:   PT Evaluation $PT Eval Low Complexity: 1 Low PT Treatments $Therapeutic Exercise: 8-22 mins       Leitha Bleak, PT 09/17/21, 12:06 PM

## 2021-09-17 NOTE — Progress Notes (Addendum)
     [facility] REFERRAL        Occupational Therapy * Physical Therapy * Speech Therapy                           DATE 09/17/21  PATIENT NAME Zachary [name]   PATIENT MRN [id number]       DIAGNOSIS/DIAGNOSIS CODE R65.10  DATE OF DISCHARGE: TBD       PRIMARY CARE PHYSICIAN    Dr. Thereasa Distance PCP PHONE/FAX :  [phone]     Dear Provider (Name: Percell Locus Outpatient Fax: [phone]   I certify that I have examined this patient and that occupational/physical/speech therapy is necessary on an outpatient basis.    The patient has expressed interest in completing their recommended course of therapy at your  location.  Once a formal order from the patient's primary care physician has been obtained, please contact him/her to schedule an appointment for evaluation at your earliest convenience.   [ X]  Physical Therapy Evaluate and Treat  [  ]  Occupational Therapy Evaluate and Treat  [  ]  Speech Therapy Evaluate and Treat         The patient's primary care physician (listed above) must furnish and be responsible for a formal order such that the recommended services may be furnished while under the primary physician's care, and that the plan of care will be established and reviewed every 30 days (or more often if condition necessitates).

## 2021-09-17 NOTE — Progress Notes (Signed)
Pharmacy Antibiotic Note  Zachary [name] is a 76 y.o. male admitted on 09/16/2021 with sepsis.  Pharmacy has been consulted for Cefepime and Vancomycin dosing.  Plan: Cefepime 2 gm q12h per indication & renal fxn.  Pt given initial dose of Vancomycin 2 gm once Vancomycin 1750 mg IV Q 24 hrs.  Goal AUC 400-550. Expected AUC: 523.3 SCr used: 1.11  Pharmacy will continue to follow and will adjust abx dosing whenever warranted.  Height: '5\' 10"'$  (177.8 cm) Weight: 86.2 kg (190 lb) IBW/kg (Calculated) : 73  Temp (24hrs), Avg:99.3 F (37.4 C), Min:98.7 F (37.1 C), Max:99.8 F (37.7 C)  Recent Labs  Lab 09/16/21 2027 09/17/21 0024  WBC 17.7*  --   CREATININE 1.11  --   LATICACIDVEN  --  1.0    Estimated Creatinine Clearance: 59.4 mL/min (by C-G formula based on SCr of 1.11 mg/dL).    Allergies  Allergen Reactions   Peanuts [Peanut Oil] Anaphylaxis   Atorvastatin Other (See Comments)    Pt states that he had a stroke while taking this medication and was advised by his neurologist not to take it.   Other reaction(s): Unknown   Sulfa Antibiotics Hives and Other (See Comments)   Fluticasone-Salmeterol Rash    Other reaction(s): Other (See Comments)    Antimicrobials this admission: 6/19 Cefepime >>  6/19 Vancomycin >>  6/19 Flagyl >> x 1 dose  Microbiology results: 6/19 BCx: Pending 6/19 UCx: Pending  Thank you for allowing pharmacy to be a part of this patient's care.  Renda Rolls, PharmD, [facility] 09/17/2021 2:40 AM

## 2021-09-18 DIAGNOSIS — R6510 Systemic inflammatory response syndrome (SIRS) of non-infectious origin without acute organ dysfunction: Secondary | ICD-10-CM | POA: Diagnosis not present

## 2021-09-18 DIAGNOSIS — J441 Chronic obstructive pulmonary disease with (acute) exacerbation: Secondary | ICD-10-CM | POA: Diagnosis not present

## 2021-09-18 LAB — URINE CULTURE: Culture: NO GROWTH

## 2021-09-18 MED ORDER — DOXYCYCLINE HYCLATE 100 MG PO TABS: 100.0000 mg | ORAL_TABLET | Freq: Two times a day (BID) | ORAL | 0 refills | Status: DC

## 2021-09-18 MED ORDER — PREDNISONE 10 MG PO TABS: ORAL_TABLET | ORAL | 0 refills | Status: AC

## 2021-09-18 MED ORDER — DOXYCYCLINE HYCLATE 100 MG PO TABS: 100.0000 mg | ORAL_TABLET | Freq: Two times a day (BID) | ORAL | 0 refills | Status: AC

## 2021-09-18 MED ORDER — STERILE WATER FOR INJECTION IJ SOLN
INTRAMUSCULAR | Status: AC
  Administered 2021-09-18: 10 mL
  Filled 2021-09-18: qty 10

## 2021-09-18 NOTE — Discharge Summary (Signed)
Physician Discharge Summary  Zachary [name]:[id number] DOB: 1945/04/26 DOA: 09/16/2021  PCP: Sofie Hartigan, MD  Admit date: 09/16/2021 Discharge date: 09/18/2021  Admitted From: Home Disposition:  Home  Recommendations for Outpatient Follow-up:  Follow up with PCP in 1-2 weeks Please obtain BMP/CBC in one week   Home Health:Yes Equipment/Devices:None Discharge Condition:Stable CODE STATUS:Full Diet recommendation: Heart Healthy   Brief/Interim Summary: 76 y.o. 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  In the ER was found to be tachypneic with leukocytosis CT of the head unremarkable UA and chest x-ray showed were unremarkable.  Due to concerns of sepsis was started empirically on antibiotics and fluid resuscitated.  Discharge Diagnoses:  Principal Problem:   SIRS (systemic inflammatory response syndrome) (HCC) Active Problems:   COPD exacerbation (HCC)   COPD with acute bronchitis (HCC)  SIRS possibly due to COPD exacerbation: He was on empirically on IV vancomycin Flagyl and cefepime. He was resuscitated. He was also started on steroids his breathing improved. He was de-escalated to oral Doxy chest x-ray showed no acute findings. He remained afebrile. Physical therapy evaluated the patient and recommended home health PT. He was ambulated and saturations were checked and they remain above 88% but just barely.  Generalized weakness: CK of 40 cardiac biomarkers remain flat. MRI showed multilevel changes. He relates it back did not change. Physical therapy evaluated patient and recommended home health PT.  Essential hypertension: No change made to his medication.  CAD: Continue  aspirin and statins.   Discharge Instructions  Discharge Instructions     Diet - low sodium heart healthy   Complete by: As directed    Increase activity slowly   Complete by: As directed       Allergies as of 09/18/2021       Reactions   Peanuts [peanut Oil] Anaphylaxis   Atorvastatin Other (See Comments)   Pt states that he had a stroke while taking this medication and was advised by his neurologist not to take it.   Other reaction(s): Unknown   Sulfa Antibiotics Hives, Other (See Comments)   Fluticasone-salmeterol Rash   Other reaction(s): Other (See Comments)        Medication List     STOP taking these medications    diphenhydramine-acetaminophen 25-500 MG Tabs tablet Commonly known as: TYLENOL PM       TAKE these medications    albuterol (2.5 MG/3ML) 0.083% nebulizer solution Commonly known as: PROVENTIL Take 3 mLs (2.5 mg total) by nebulization every 4 (four) hours as needed for wheezing or shortness of breath. What changed: Another medication with the same name was removed. Continue taking this medication, and follow the directions you see here.   aspirin EC 81 MG tablet Take 81 mg by mouth daily.   budesonide-formoterol 160-4.5 MCG/ACT inhaler Commonly known as: SYMBICORT Inhale 2 puffs into the lungs 2 (two) times daily.   doxycycline 100 MG tablet Commonly known as: VIBRA-TABS Take 1 tablet (100 mg total) by mouth 2 (two) times daily.   Dulera 200-5 MCG/ACT Aero Generic drug: mometasone-formoterol Inhale 2 puffs into the lungs 2 (two) times daily.   fluticasone 50 MCG/ACT nasal spray Commonly known as: Flonase Place 1 spray into both nostrils daily.  guaiFENesin 600 MG 12 hr tablet Commonly known as: MUCINEX Take 1 tablet (600 mg total) by mouth 2 (two) times daily.   isosorbide mononitrate 30 MG 24 hr tablet Commonly known as: IMDUR Take 30 mg by mouth at bedtime.   metoprolol tartrate 50 MG tablet Commonly known as:  LOPRESSOR Take 50 mg by mouth 2 (two) times daily.   predniSONE 10 MG tablet Commonly known as: DELTASONE Takes 6 tablets for 1 days, then 5 tablets for 1 days, then 4 tablets for 1 days, then 3 tablets for 1 days, then 2 tabs for 1 days, then 1 tab for 1 days, and then stop.   rosuvastatin 5 MG tablet Commonly known as: CRESTOR Take 2.5 mg by mouth at bedtime.   tiotropium 18 MCG inhalation capsule Commonly known as: SPIRIVA Place 1 capsule (18 mcg total) into inhaler and inhale daily.        Allergies  Allergen Reactions   Peanuts [Peanut Oil] Anaphylaxis   Atorvastatin Other (See Comments)    Pt states that he had a stroke while taking this medication and was advised by his neurologist not to take it.   Other reaction(s): Unknown   Sulfa Antibiotics Hives and Other (See Comments)   Fluticasone-Salmeterol Rash    Other reaction(s): Other (See Comments)    Consultations: None   Procedures/Studies: MR LUMBAR SPINE WO CONTRAST  Result Date: 09/17/2021 CLINICAL DATA:  76 year old male with low back pain and weakness. EXAM: MRI LUMBAR SPINE WITHOUT CONTRAST TECHNIQUE: Multiplanar, multisequence MR imaging of the lumbar spine was performed. No intravenous contrast was administered. COMPARISON:  Abdominal radiographs 08/31/2005. FINDINGS: Segmentation:  Normal on the comparison. Alignment: Mildly exaggerated lumbar lordosis. Subtle anterolisthesis of L4 on L5. Similar mild retrolisthesis of L5 on S1. Vertebrae: L4 mild superior endplate compression and central superior endplate Schmorl's node (series 7, image 9), but no significant No marrow edema or evidence of acute osseous abnormality. Normal background bone marrow signal. No other No acute osseous abnormality identified. Visible sacrum and SI joints appear intact. Conus medullaris and cauda equina: Conus extends to the L1 level. No lower spinal cord or conus signal abnormality. Paraspinal and other soft tissues: Negative. Disc  levels: T11-T12: Partially visible, grossly negative. T12-L1:  Negative. L1-L2: Small left paracentral and cephalad disc extrusion (series 8, image 10) with estimated 6 x 12 mm small sequestered disc fragment posterior to L1. Superimposed mild facet hypertrophy at the disc space level and minor disc bulging. No significant stenosis. L2-L3: Disc desiccation with mild circumferential disc bulge. Mild to moderate facet and ligament flavum hypertrophy. No significant stenosis. L3-L4: Disc desiccation and disc space loss. Circumferential disc bulge and endplate spurring eccentric to the left. Mild to moderate facet and ligament flavum hypertrophy. Mild spinal stenosis. Up to mild lateral recess stenosis (L4 nerve levels) and bilateral L3 foraminal stenosis. L4-L5: Subtle anterolisthesis. Circumferential disc bulge. Mild to moderate facet and ligament flavum hypertrophy. Degenerative facet joint fluid. Mild bilateral lateral recess stenosis without spinal stenosis (L5 nerve levels). No foraminal stenosis. L5-S1: Mild retrolisthesis. Disc space loss. Circumferential disc osteophyte complex. Mild facet hypertrophy. No stenosis. IMPRESSION: 1. Mild L4 superior endplate compression and Schmorl's node with no significant marrow edema, suggesting late subacute or chronic time course. 2. Small left paracentral and cephalad disc extrusion at L1-L2 with a small sequestered disc fragment posterior to L1. No associated stenosis. 3. But there is mild multifactorial spinal stenosis at L3-L4, with up to mild foraminal and lateral recess stenosis (L3  and L4 nerve levels). 4. Subtle anterolisthesis at both L4-L5 and L5-S1 with disc and posterior element degeneration. Mild lateral recess stenosis at the bilateral L5 nerve levels. Electronically Signed   By: Genevie Ann M.D.   On: 09/17/2021 05:44   DG Chest Portable 1 View  Result Date: 09/16/2021 CLINICAL DATA:  Shortness of breath increasing since Thursday. EXAM: PORTABLE CHEST 1 VIEW  COMPARISON:  05/05/2015 FINDINGS: Postoperative changes in the mediastinum. Heart size and pulmonary vascularity are normal. No airspace disease or consolidation in the lungs. Probable emphysematous changes in the upper lungs. No pleural effusions. No pneumothorax. Calcification of the aorta. Degenerative changes in the spine and shoulders. IMPRESSION: Emphysematous changes in the lungs.  No focal consolidation. Electronically Signed   By: Lucienne Capers M.D.   On: 09/16/2021 21:37   CT HEAD WO CONTRAST (5MM)  Result Date: 09/16/2021 CLINICAL DATA:  Acute neurological deficit with stroke suspected. Weakness since Thursday. EXAM: CT HEAD WITHOUT CONTRAST TECHNIQUE: Contiguous axial images were obtained from the base of the skull through the vertex without intravenous contrast. RADIATION DOSE REDUCTION: This exam was performed according to the departmental dose-optimization program which includes automated exposure control, adjustment of the mA and/or kV according to patient size and/or use of iterative reconstruction technique. COMPARISON:  CT head 05/23/2005.  MRI brain 04/12/2005 FINDINGS: Brain: Mild cerebral atrophy. No ventricular dilatation. Low-attenuation changes in the deep white matter consistent with small vessel ischemia. Focal area of encephalomalacia in the posterior right parietal lobe likely represents an old infarct. Old infarct again demonstrated in the left thalamus without change. No mass-effect or midline shift. No abnormal extra-axial fluid collections. Gray-white matter junctions are mostly distinct. Basal cisterns are not effaced. No acute intracranial hemorrhage. Vascular: Moderate intracranial arterial vascular calcifications. Skull: Normal. Negative for fracture or focal lesion. Sinuses/Orbits: Mucosal thickening in the paranasal sinuses. Mastoid air cells are clear. Other: None. IMPRESSION: 1. No acute intracranial abnormalities. 2. Chronic atrophy and small vessel ischemic changes.  3. Probable old infarct in the right posterior frontal region. Electronically Signed   By: Lucienne Capers M.D.   On: 09/16/2021 21:25   (Echo, Carotid, EGD, Colonoscopy, ERCP)    Subjective: No complaints  Discharge Exam: Vitals:   09/18/21 0600 09/18/21 0829  BP: (!) 153/97 (!) 149/89  Pulse: 77 81  Resp: 18 16  Temp: 98.1 F (36.7 C) 98.6 F (37 C)  SpO2: 96% 95%   Vitals:   09/17/21 2012 09/18/21 0002 09/18/21 0600 09/18/21 0829  BP: (!) 144/86 124/70 (!) 153/97 (!) 149/89  Pulse: 99 79 77 81  Resp: '16 16 18 16  '$ Temp: 98 F (36.7 C) 97.8 F (36.6 C) 98.1 F (36.7 C) 98.6 F (37 C)  TempSrc:    Oral  SpO2: 96% 96% 96% 95%  Weight:      Height:        General: Pt is alert, awake, not in acute distress Cardiovascular: RRR, S1/S2 +, no rubs, no gallops Respiratory: CTA bilaterally, no wheezing, no rhonchi Abdominal: Soft, NT, ND, bowel sounds + Extremities: no edema, no cyanosis    The results of significant diagnostics from this hospitalization (including imaging, microbiology, ancillary and laboratory) are listed below for reference.     Microbiology: Recent Results (from the past 240 hour(s))  Urine Culture     Status: None   Collection Time: 09/16/21  8:46 PM   Specimen: In/Out Cath Urine  Result Value Ref Range Status   Specimen Description  Final    IN/OUT CATH URINE Performed at [facility], [address], [place], [place] [address]    Special Requests   Final    NONE Performed at [facility], [address], [place], [place] [address]    Culture   Final    NO GROWTH Performed at [facility], [place] [address], [place], [place] [address]    Report Status 09/18/2021 FINAL  Final  Resp Panel by RT-PCR (Flu A&B, Covid) Anterior Nasal Swab     Status: None   Collection Time: 09/17/21 12:24 AM   Specimen: Anterior Nasal Swab  Result Value Ref Range Status   SARS Coronavirus 2 by RT PCR NEGATIVE NEGATIVE Final     Comment: (NOTE) SARS-CoV-2 target nucleic acids are NOT DETECTED.  The SARS-CoV-2 RNA is generally detectable in upper respiratory specimens during the acute phase of infection. The lowest concentration of SARS-CoV-2 viral copies this assay can detect is 138 copies/mL. A negative result does not preclude SARS-Cov-2 infection and should not be used as the sole basis for treatment or other patient management decisions. A negative result may occur with  improper specimen collection/handling, submission of specimen other than nasopharyngeal swab, presence of viral mutation(s) within the areas targeted by this assay, and inadequate number of viral copies(<138 copies/mL). A negative result must be combined with clinical observations, patient history, and epidemiological information. The expected result is Negative.  Fact Sheet for Patients:  EntrepreneurPulse.com.au  Fact Sheet for Healthcare Providers:  IncredibleEmployment.be  This test is no t yet approved or cleared by the [place] FDA and  has been authorized for detection and/or diagnosis of SARS-CoV-2 by FDA under an Emergency Use Authorization (EUA). This EUA will remain  in effect (meaning this test can be used) for the duration of the COVID-19 declaration under Section 564(b)(1) of the Act, 21 U.S.C.section 360bbb-3(b)(1), unless the authorization is terminated  or revoked sooner.       Influenza A by PCR NEGATIVE NEGATIVE Final   Influenza B by PCR NEGATIVE NEGATIVE Final    Comment: (NOTE) The Xpert Xpress SARS-CoV-2/FLU/RSV plus assay is intended as an aid in the diagnosis of influenza from Nasopharyngeal swab specimens and should not be used as a sole basis for treatment. Nasal washings and aspirates are unacceptable for Xpert Xpress SARS-CoV-2/FLU/RSV testing.  Fact Sheet for Patients: EntrepreneurPulse.com.au  Fact Sheet for Healthcare  Providers: IncredibleEmployment.be  This test is not yet approved or cleared by the [place] FDA and has been authorized for detection and/or diagnosis of SARS-CoV-2 by FDA under an Emergency Use Authorization (EUA). This EUA will remain in effect (meaning this test can be used) for the duration of the COVID-19 declaration under Section 564(b)(1) of the Act, 21 U.S.C. section 360bbb-3(b)(1), unless the authorization is terminated or revoked.  Performed at [facility], [address] [place], [place] [address]   Blood Culture (routine x 2)     Status: None (Preliminary result)   Collection Time: 09/17/21 12:24 AM   Specimen: BLOOD  Result Value Ref Range Status   Specimen Description BLOOD RIGTH [facility]  Final   Special Requests   Final    BOTTLES DRAWN AEROBIC AND ANAEROBIC Blood Culture results may not be optimal due to an inadequate volume of blood received in culture bottles   Culture   Final    NO GROWTH 1 DAY Performed at [facility], [address] [place], [place] [address]    Report  Status PENDING  Incomplete  Blood Culture (routine x 2)     Status: None (Preliminary result)   Collection Time: 09/17/21 12:24 AM   Specimen: BLOOD  Result Value Ref Range Status   Specimen Description BLOOD LEFT AC  Final   Special Requests   Final    BOTTLES DRAWN AEROBIC AND ANAEROBIC Blood Culture adequate volume   Culture   Final    NO GROWTH 1 DAY Performed at [facility], [address], [place], [place] [address]    Report Status PENDING  Incomplete     Labs: BNP (last 3 results) No results for input(s): "BNP" in the last 8760 hours. Basic Metabolic Panel: Recent Labs  Lab 09/16/21 2027 09/17/21 0548  NA 135 135  K 4.0 3.6  CL 106 107  CO2 21* 21*  GLUCOSE 123* 117*  BUN 15 13  CREATININE 1.11 0.87  CALCIUM 9.9 8.8*   Liver Function Tests: Recent Labs  Lab 09/16/21 2027  AST 17  ALT 17  ALKPHOS 74  BILITOT  0.8  PROT 9.3*  ALBUMIN 3.9   No results for input(s): "LIPASE", "AMYLASE" in the last 168 hours. No results for input(s): "AMMONIA" in the last 168 hours. CBC: Recent Labs  Lab 09/16/21 2027 09/17/21 0548  WBC 17.7* 13.7*  NEUTROABS 14.5* 10.7*  HGB 14.6 13.0  HCT 45.4 40.8  MCV 92.1 92.1  PLT 266 191   Cardiac Enzymes: Recent Labs  Lab 09/17/21 0548  CKTOTAL 40*   BNP: Invalid input(s): "POCBNP" CBG: No results for input(s): "GLUCAP" in the last 168 hours. D-Dimer No results for input(s): "DDIMER" in the last 72 hours. Hgb A1c No results for input(s): "HGBA1C" in the last 72 hours. Lipid Profile No results for input(s): "CHOL", "HDL", "LDLCALC", "TRIG", "CHOLHDL", "LDLDIRECT" in the last 72 hours. Thyroid function studies Recent Labs    09/17/21 0548  TSH 1.268   Anemia work up No results for input(s): "VITAMINB12", "FOLATE", "FERRITIN", "TIBC", "IRON", "RETICCTPCT" in the last 72 hours. Urinalysis    Component Value Date/Time   COLORURINE YELLOW (A) 09/16/2021 2046   APPEARANCEUR CLEAR (A) 09/16/2021 2046   LABSPEC 1.020 09/16/2021 2046   PHURINE 5.0 09/16/2021 2046   GLUCOSEU NEGATIVE 09/16/2021 2046   HGBUR NEGATIVE 09/16/2021 2046   BILIRUBINUR NEGATIVE 09/16/2021 2046   [place] NEGATIVE 09/16/2021 2046   PROTEINUR 30 (A) 09/16/2021 2046   NITRITE NEGATIVE 09/16/2021 2046   LEUKOCYTESUR NEGATIVE 09/16/2021 2046   Sepsis Labs Recent Labs  Lab 09/16/21 2027 09/17/21 0548  WBC 17.7* 13.7*   Microbiology Recent Results (from the past 240 hour(s))  Urine Culture     Status: None   Collection Time: 09/16/21  8:46 PM   Specimen: In/Out Cath Urine  Result Value Ref Range Status   Specimen Description   Final    IN/OUT CATH URINE Performed at [facility], [address] [place], [place] [address]    Special Requests   Final    NONE Performed at [facility], [address], [place], [place] [address]    Culture   Final     NO GROWTH Performed at [facility], [place] [address], [place], [place] [address]    Report Status 09/18/2021 FINAL  Final  Resp Panel by RT-PCR (Flu A&B, Covid) Anterior Nasal Swab     Status: None   Collection Time: 09/17/21 12:24 AM   Specimen: Anterior Nasal Swab  Result Value Ref Range Status  SARS Coronavirus 2 by RT PCR NEGATIVE NEGATIVE Final    Comment: (NOTE) SARS-CoV-2 target nucleic acids are NOT DETECTED.  The SARS-CoV-2 RNA is generally detectable in upper respiratory specimens during the acute phase of infection. The lowest concentration of SARS-CoV-2 viral copies this assay can detect is 138 copies/mL. A negative result does not preclude SARS-Cov-2 infection and should not be used as the sole basis for treatment or other patient management decisions. A negative result may occur with  improper specimen collection/handling, submission of specimen other than nasopharyngeal swab, presence of viral mutation(s) within the areas targeted by this assay, and inadequate number of viral copies(<138 copies/mL). A negative result must be combined with clinical observations, patient history, and epidemiological information. The expected result is Negative.  Fact Sheet for Patients:  EntrepreneurPulse.com.au  Fact Sheet for Healthcare Providers:  IncredibleEmployment.be  This test is no t yet approved or cleared by the [place] FDA and  has been authorized for detection and/or diagnosis of SARS-CoV-2 by FDA under an Emergency Use Authorization (EUA). This EUA will remain  in effect (meaning this test can be used) for the duration of the COVID-19 declaration under Section 564(b)(1) of the Act, 21 U.S.C.section 360bbb-3(b)(1), unless the authorization is terminated  or revoked sooner.       Influenza A by PCR NEGATIVE NEGATIVE Final   Influenza B by PCR NEGATIVE NEGATIVE Final    Comment: (NOTE) The Xpert Xpress  SARS-CoV-2/FLU/RSV plus assay is intended as an aid in the diagnosis of influenza from Nasopharyngeal swab specimens and should not be used as a sole basis for treatment. Nasal washings and aspirates are unacceptable for Xpert Xpress SARS-CoV-2/FLU/RSV testing.  Fact Sheet for Patients: EntrepreneurPulse.com.au  Fact Sheet for Healthcare Providers: IncredibleEmployment.be  This test is not yet approved or cleared by the [place] FDA and has been authorized for detection and/or diagnosis of SARS-CoV-2 by FDA under an Emergency Use Authorization (EUA). This EUA will remain in effect (meaning this test can be used) for the duration of the COVID-19 declaration under Section 564(b)(1) of the Act, 21 U.S.C. section 360bbb-3(b)(1), unless the authorization is terminated or revoked.  Performed at [facility], [address] [place], [place] [address]   Blood Culture (routine x 2)     Status: None (Preliminary result)   Collection Time: 09/17/21 12:24 AM   Specimen: BLOOD  Result Value Ref Range Status   Specimen Description BLOOD RIGTH [facility]  Final   Special Requests   Final    BOTTLES DRAWN AEROBIC AND ANAEROBIC Blood Culture results may not be optimal due to an inadequate volume of blood received in culture bottles   Culture   Final    NO GROWTH 1 DAY Performed at [facility], [address] [place], [place] [address]    Report Status PENDING  Incomplete  Blood Culture (routine x 2)     Status: None (Preliminary result)   Collection Time: 09/17/21 12:24 AM   Specimen: BLOOD  Result Value Ref Range Status   Specimen Description BLOOD LEFT [facility]  Final   Special Requests   Final    BOTTLES DRAWN AEROBIC AND ANAEROBIC Blood Culture adequate volume   Culture   Final    NO GROWTH 1 DAY Performed at [facility], [address] [place], [place] [address]    Report Status PENDING  Incomplete     SIGNED:   Charlynne Cousins, MD  [facility] 09/18/2021, 10:26 AM Pager  If 7PM-7AM, please contact night-coverage [email or web address] Password [email or web address]

## 2021-09-18 NOTE — TOC Transition Note (Signed)
Transition of Care [facility]) - CM/SW Discharge Note   Patient Details  Name: Zachary [name] MRN: [id number] Date of Birth: 29-Aug-1945  Transition of Care [facility] CM/SW Contact:  Alberteen Sam, LCSW Phone Number: 09/18/2021, 11:07 AM   Clinical Narrative:     Patient to discharge home today with outpatient PT. All clinicals and PT cosigned referral by MD have been faxed to [facility] outpatient PT preference at [phone].  No further discharge needs identified.    Final next level of care: Home/Self Care Barriers to Discharge: No Barriers Identified   Patient Goals and CMS Choice Patient states their goals for this hospitalization and ongoing recovery are:: to go home CMS Medicare.gov Compare Post Acute Care list provided to:: Patient Choice offered to / list presented to : Patient  Discharge Placement                       Discharge Plan and Services                                     Social Determinants of Health (SDOH) Interventions     Readmission Risk Interventions     No data to display

## 2021-09-22 LAB — CULTURE, BLOOD (ROUTINE X 2)
Culture: NO GROWTH
Culture: NO GROWTH
Special Requests: ADEQUATE

## 2021-10-22 ENCOUNTER — Ambulatory Visit: Payer: Medicare Other | Attending: Family Medicine | Admitting: Physical Therapy

## 2021-10-22 DIAGNOSIS — R269 Unspecified abnormalities of gait and mobility: Secondary | ICD-10-CM | POA: Insufficient documentation

## 2021-10-22 DIAGNOSIS — M545 Low back pain, unspecified: Secondary | ICD-10-CM | POA: Insufficient documentation

## 2021-10-22 DIAGNOSIS — G8929 Other chronic pain: Secondary | ICD-10-CM | POA: Insufficient documentation

## 2021-10-22 DIAGNOSIS — M6281 Muscle weakness (generalized): Secondary | ICD-10-CM | POA: Diagnosis not present

## 2021-10-22 NOTE — Therapy (Signed)
OUTPATIENT PHYSICAL THERAPY LOWER EXTREMITY EVALUATION   Patient Name: Zachary [name] MRN: [id number] DOB:November 29, 1945, 76 y.o., male Today's Date: 10/22/2021   PT End of Session - 10/22/21 2017     Visit Number 1    Number of Visits 17    Date for PT Re-Evaluation 12/17/21    PT Start Time 1017    PT Stop Time 4970    PT Time Calculation (min) 59 min    Activity Tolerance Patient tolerated treatment well    Behavior During Therapy Zach[name] for tasks assessed/performed             Past Medical History:  Diagnosis Date   Asthma    CHF (congestive heart failure) ([place])    no CPAP   Chronic cough    COPD (chronic obstructive pulmonary disease) (HCC)    Coronary artery disease    Dyspnea    High cholesterol    Hypertension    Melanoma ([place])    Orthopnea    sleeps in a chair   Seasonal allergies    Stroke [facility])    Jan 2007   Past Surgical History:  Procedure Laterality Date   CARDIAC SURGERY     CABG x 5  2007   CATARACT EXTRACTION W/PHACO Left 07/07/2017   Procedure: CATARACT EXTRACTION PHACO AND INTRAOCULAR LENS PLACEMENT ([name]);  Surgeon: Zachary [name];  Location: [name];  Service: Ophthalmology;  Laterality: Left;  IVA TOPICAL LEFT   CATARACT EXTRACTION W/PHACO Right 07/28/2017   Procedure: CATARACT EXTRACTION PHACO AND INTRAOCULAR LENS PLACEMENT ([place] [name]) RIGHT;  Surgeon: Zachary [name];  Location: Zachary [name];  Service: Ophthalmology;  Laterality: Right;   CORONARY ARTERY BYPASS GRAFT     x 5 in 2007   NASAL SINUS SURGERY     UVULECTOMY     Patient Active Problem List   Diagnosis Date Noted   COPD with acute bronchitis ([place]) 09/17/2021   SIRS (systemic inflammatory response syndrome) ([place]) 09/16/2021   Ludwig's angina 10/24/2016   Acute respiratory failure with hypoxia ([place]) 05/06/2015   Acute bronchitis 05/06/2015   Leukocytosis 05/06/2015   Hyperglycemia 05/06/2015   COPD exacerbation ([place]) 05/05/2015    PCP:  Zachary Hartigan, [name]  REFERRING PROVIDER: Sofie Hartigan, [name]  REFERRING DIAG: Generalized Weakness  THERAPY DIAG:  Muscle weakness (generalized)  Gait difficulty  Chronic left-sided low back pain without sciatica  Rationale for Evaluation and Treatment Rehabilitation  ONSET DATE: 01/30/2021  SUBJECTIVE:   SUBJECTIVE STATEMENT: Pt. States he has was diagnosed with upper respiratory infection last November.  Pt. States he has been on 4+ series of antibiotics.  Pt. States he got a lot weaker and had difficulty getting out of recliner on Father's Day.  Pt. Reports being 75% to baseline.  Pt. Drives independently and walks dogs ever day.  No current exercise program.  Pt. Has arthritis in R knee/ R hand/ L hip per pt.  Mild stroke in 2007.  Play guitar/ bass in band.  Retired from [employer] (part-time) in May.    PERTINENT HISTORY: Zachary Socks., [name] - 09/24/2021 1:00 PM EDT Formatting of this note is different from the original. Images from the original note were not included. Zachary [name] is a 76 y.o. male that comes today for the following problem(s):  Chief Complaint  Patient presents with  [name] Follow Up  weakness   HPI: Patient in the office for follow-up of recent hospitalization for generalized weakness and COPD  exacerbation. He was admitted on 09/16/2021 and discharged on 09/18/2021. He was placed on IV vancomycin, Flagyl and cefepime initially and then doxycycline and prednisone orally. He has completed the course of antibiotics and steroids as instructed. He notes his cough is significantly improved but still present. He notes the sputum was once again yellowish and this is almost cleared. He was previously treated with Levaquin after sputum culture demonstrated Pseudomonas sensitive to quinolones earlier this year. He has followed with cardiology since discharge and a referral to pulmonology at his request was placed. He denies shortness of breath  or wheezing currently. He notes his weakness is slightly improved but he still wishes to start physical therapy. He does not wish to do home therapy but wants to come to the office to do therapy and needs a referral placed. He notes prior to being taken to the [name] he fell out of his chair to the ground and was unable to get himself up. While in the [name] he was noted to have leukocytosis and this improved but did not resolve prior to discharge. He continues to use his [place] and [employer] Ellipta regularly.  Goals   Maintain health/healthy lifestyle     Patient Active Problem List  Diagnosis  Asthma without status asthmaticus  Allergic rhinitis  S/P coronary artery bypass graft x 5  Essential hypertension  Hyperlipidemia  CVA (cerebral vascular accident) (CMS-HCC)  Apnea, sleep  Chest pain with high risk for cardiac etiology  Impaired glucose tolerance  Seasonal allergic rhinitis due to pollen  COPD with exacerbation (CMS-HCC)  Acute bronchitis  COLD (chronic obstructive lung disease) (CMS-HCC)  Hyperglycemia  Elevated white blood cell count  CAD (coronary artery disease)  Hypertension  SOB (shortness of breath) on exertion  Aspiration of fluid causing abnormal reaction or later complication  Dysphagia, pharyngeal phase  Dysphagia, pharyngoesophageal phase   Past Medical History:  Diagnosis Date  Allergic state  Asthma without status asthmaticus  Bronchitis, chronic (CMS-HCC)  CAD (coronary artery disease)  Cataract cortical, senile  s/p surgical correction  Chronic sinusitis  with polyps  COPD (chronic obstructive pulmonary disease) (CMS-HCC) 2015  Elevated cholesterol  Hypertension  Melanoma (CMS-HCC)  malignant  Pneumonia 2023  Sleep apnea  requiring CPAP which the patient does not wear  Stroke (CMS-HCC) 04/12/2005  CVA, hemorrhagic - [name]    PAIN:  Are you having pain? Yes: NPRS scale: 0-2/10 Pain location: L low back Pain description:  achy/ stiff Aggravating factors: yardwork Relieving factors: rest  PRECAUTIONS: Fall  WEIGHT BEARING RESTRICTIONS No  FALLS:  Has patient fallen in last 6 months? No  LIVING ENVIRONMENT: Lives with: lives with their family Lives in: House/apartment  OCCUPATION: retired  PLOF: Independent  PATIENT GOALS Increase LE muscle strength/ improve gait.     OBJECTIVE:   PATIENT SURVEYS:  FOTO initial 53/ goal 54  COGNITION:  Overall cognitive status: Within functional limits for tasks assessed     SENSATION: WFL  MUSCLE LENGTH: Hamstrings: Right 50 deg; Left 50 deg  (moderate hamstring/ gastroc tightness)  POSTURE: rounded shoulders, forward head, and flexed trunk   LOWER EXTREMITY ROM:  B shoulder AROM WFL.  B UE strength grossly 5/5 MMT except L/R shoulder flexion 4/5 MMT.   B LE AROM WFL.    LOWER EXTREMITY MMT:  MMT Right eval Left eval  Hip flexion 4/5 4/5  Hip extension    Hip abduction 4+/5  4+/5  Hip adduction 4+/5  4+/5  Hip internal rotation  Hip external rotation    Knee flexion 5/5 5/5  Knee extension 4+/5 4+/5  Ankle dorsiflexion 5/5 5/5  Ankle plantarflexion    Ankle inversion    Ankle eversion     (Blank rows = not tested)  FUNCTIONAL TESTS:  5 times sit to stand: 12.31 sec./ 11.56 sec.  GAIT: Distance walked: in clinic Assistive device utilized: None Level of assistance: Complete Independence Comments: Decrease L hip flexion/ step pattern/ heel strike noted.  No LOB during eval.     TODAY'S TREATMENT: See HEP (issued handouts).     PATIENT EDUCATION:  Education details: Access Code: [email or web address] Person educated: Patient Education method: Explanation, Demonstration, and Verbal cues Education comprehension: verbalized understanding and returned demonstration   HOME EXERCISE PROGRAM: Access Code: [email or web address] URL: [email or web address] Date: 10/22/2021 Prepared by: Dorcas Carrow   Exercises - Supine Bridge  - 1 x  daily - 7 x weekly - 1 sets - 20 reps - Supine March  - 1 x daily - 7 x weekly - 1 sets - 20 reps - Sit to Stand Without Arm Support  - 1 x daily - 7 x weekly - 2 sets - 10 reps - Standing Hip Flexion March  - 1 x daily - 7 x weekly - 1 sets - 20 reps  ASSESSMENT:  CLINICAL IMPRESSION: Patient is a pleasant 76 y.o. male who was seen today for physical therapy evaluation and treatment for LE muscle weakness/ gait difficulty.  Pt. Presents with B LE muscle weakness, esp. In hip flexors.  Pt. Is currently not participating with exercise program and will benefit from skilled PT services to increase LE muscle strength/ improve safety/ pain-free mobility.     OBJECTIVE IMPAIRMENTS Abnormal gait, decreased activity tolerance, decreased balance, decreased coordination, decreased endurance, decreased mobility, difficulty walking, decreased ROM, decreased strength, impaired flexibility, improper body mechanics, and pain.   ACTIVITY LIMITATIONS carrying, lifting, bending, standing, squatting, stairs, and transfers  PARTICIPATION LIMITATIONS: cleaning, community activity, and yard work  PERSONAL FACTORS Age, Fitness, and Past/current experiences are also affecting patient's functional outcome.   REHAB POTENTIAL: Good  CLINICAL DECISION MAKING: Evolving/moderate complexity  EVALUATION COMPLEXITY: Moderate   GOALS: Goals reviewed with patient? Yes  SHORT TERM GOALS: Target date: 11/25/21 Pt. Will increase B hip flexion muscle strength 1/2 muscle grade to improve pain-free mobility  Baseline: see above Goal status: INITIAL   LONG TERM GOALS: Target date: 12/17/21  Pt. Will increase FOTO to 67 to improve pain-free mobility.   Baseline: initial 53 Goal status: INITIAL  2.  Pt. Will decrease 5xSTS to <10 sec. To improve mobility/ decrease fall risk.  Baseline: see above Goal status: INITIAL  3.  Pt. Able to complete 30 minutes of there.ex. with no increase c/o pain to improve walking/  endurance/ mobility Baseline:  pt. Currently not participating with ex. Program.  Goal status: INITIAL    PLAN: PT FREQUENCY: 2x/week  PT DURATION: 8 weeks  PLANNED INTERVENTIONS: Therapeutic exercises, Therapeutic activity, Neuromuscular re-education, Balance training, Gait training, Patient/Family education, Self Care, Joint mobilization, and Manual therapy  PLAN FOR NEXT SESSION: Reassess HEP/ dynamic balance tasks/ LE muscle strengthening  Pura Spice, PT, DPT # [phone] 10/22/2021, 8:21 PM

## 2021-10-22 NOTE — Patient Instructions (Signed)
Access Code: [email or web address] URL: [email or web address] Date: 10/22/2021 Prepared by: Dorcas Carrow  Exercises - Supine Bridge  - 1 x daily - 7 x weekly - 1 sets - 20 reps - Supine March  - 1 x daily - 7 x weekly - 1 sets - 20 reps - Sit to Stand Without Arm Support  - 1 x daily - 7 x weekly - 2 sets - 10 reps - Standing Hip Flexion March  - 1 x daily - 7 x weekly - 1 sets - 20 reps

## 2021-10-24 ENCOUNTER — Ambulatory Visit: Payer: Medicare Other | Admitting: Physical Therapy

## 2021-10-24 DIAGNOSIS — M6281 Muscle weakness (generalized): Secondary | ICD-10-CM

## 2021-10-24 DIAGNOSIS — R269 Unspecified abnormalities of gait and mobility: Secondary | ICD-10-CM

## 2021-10-24 DIAGNOSIS — M545 Low back pain, unspecified: Secondary | ICD-10-CM

## 2021-10-26 NOTE — Therapy (Signed)
OUTPATIENT PHYSICAL THERAPY LOWER EXTREMITY TREATMENT   Patient Name: COLLIER BOHNET MRN: [id number] DOB:May 06, 1945, 76 y.o., male Today's Date: 10/26/2021   PT End of Session - 10/26/21 1723     Visit Number 2    Number of Visits 17    Date for PT Re-Evaluation 12/17/21    PT Start Time 0721    PT Stop Time 0815    PT Time Calculation (min) 54 min    Activity Tolerance Patient tolerated treatment well    Behavior During Therapy [facility] for tasks assessed/performed             Past Medical History:  Diagnosis Date   Asthma    CHF (congestive heart failure) ([place])    no CPAP   Chronic cough    COPD (chronic obstructive pulmonary disease) (HCC)    Coronary artery disease    Dyspnea    High cholesterol    Hypertension    Melanoma ([place])    Orthopnea    sleeps in a chair   Seasonal allergies    Stroke [facility])    Jan 2007   Past Surgical History:  Procedure Laterality Date   CARDIAC SURGERY     CABG x 5  2007   CATARACT EXTRACTION W/PHACO Left 07/07/2017   Procedure: CATARACT EXTRACTION PHACO AND INTRAOCULAR LENS PLACEMENT ([place]);  Surgeon: Leandrew Koyanagi, MD;  Location: [place];  Service: Ophthalmology;  Laterality: Left;  IVA TOPICAL LEFT   CATARACT EXTRACTION W/PHACO Right 07/28/2017   Procedure: CATARACT EXTRACTION PHACO AND INTRAOCULAR LENS PLACEMENT ([place]) RIGHT;  Surgeon: Leandrew Koyanagi, MD;  Location: [place];  Service: Ophthalmology;  Laterality: Right;   CORONARY ARTERY BYPASS GRAFT     x 5 in 2007   NASAL SINUS SURGERY     UVULECTOMY     Patient Active Problem List   Diagnosis Date Noted   COPD with acute bronchitis ([place]) 09/17/2021   SIRS (systemic inflammatory response syndrome) ([place]) 09/16/2021   Ludwig's angina 10/24/2016   Acute respiratory failure with hypoxia ([place]) 05/06/2015   Acute bronchitis 05/06/2015   Leukocytosis 05/06/2015   Hyperglycemia 05/06/2015   COPD exacerbation ([place]) 05/05/2015    PCP:  Sofie Hartigan, MD  REFERRING PROVIDER: Sofie Hartigan, MD  REFERRING DIAG: Generalized Weakness  THERAPY DIAG:  Muscle weakness (generalized)  Gait difficulty  Chronic left-sided low back pain without sciatica  Rationale for Evaluation and Treatment Rehabilitation  ONSET DATE: 01/30/2021  SUBJECTIVE:   SUBJECTIVE STATEMENT: Pt. Reports slight back discomfort (mild ache and 1/10 on pain scale).  Pt. States he is still coughing up yellow sputum with coughing and will contact MD to discuss POC.    PERTINENT HISTORY: [name]., MD - 09/24/2021 1:00 PM EDT Formatting of this note is different from the original. Images from the original note were not included. WARNELL RASNIC is a 76 y.o. male that comes today for the following problem(s):  Chief Complaint  Patient presents with  [facility]ospital Follow Up  weakness   HPI: Patient in the office for follow-up of recent hospitalization for generalized weakness and COPD exacerbation. He was admitted on 09/16/2021 and discharged on 09/18/2021. He was placed on IV vancomycin, Flagyl and cefepime initially and then doxycycline and prednisone orally. He has completed the course of antibiotics and steroids as instructed. He notes his cough is significantly improved but still present. He notes the sputum was once again yellowish and this is almost cleared. He was previously  treated with Levaquin after sputum culture demonstrated Pseudomonas sensitive to quinolones earlier this year. He has followed with cardiology since discharge and a referral to pulmonology at his request was placed. He denies shortness of breath or wheezing currently. He notes his weakness is slightly improved but he still wishes to start physical therapy. He does not wish to do home therapy but wants to come to the office to do therapy and needs a referral placed. He notes prior to being taken to the hospital he fell out of his chair to the ground and was  unable to get himself up. While in the hospital he was noted to have leukocytosis and this improved but did not resolve prior to discharge. He continues to use his [place] and [employer] regularly.  Goals   Maintain health/healthy lifestyle     Patient Active Problem List  Diagnosis  Asthma without status asthmaticus  Allergic rhinitis  S/P coronary artery bypass graft x 5  Essential hypertension  Hyperlipidemia  CVA (cerebral vascular accident) (CMS-HCC)  Apnea, sleep  Chest pain with high risk for cardiac etiology  Impaired glucose tolerance  Seasonal allergic rhinitis due to pollen  COPD with exacerbation (CMS-HCC)  Acute bronchitis  COLD (chronic obstructive lung disease) (CMS-HCC)  Hyperglycemia  Elevated white blood cell count  CAD (coronary artery disease)  Hypertension  SOB (shortness of breath) on exertion  Aspiration of fluid causing abnormal reaction or later complication  Dysphagia, pharyngeal phase  Dysphagia, pharyngoesophageal phase   Past Medical History:  Diagnosis Date  Allergic state  Asthma without status asthmaticus  Bronchitis, chronic (CMS-HCC)  CAD (coronary artery disease)  Cataract cortical, senile  s/p surgical correction  Chronic sinusitis  with polyps  COPD (chronic obstructive pulmonary disease) (CMS-HCC) 2015  Elevated cholesterol  Hypertension  Melanoma (CMS-HCC)  malignant  Pneumonia 2023  Sleep apnea  requiring CPAP which the patient does not wear  Stroke (CMS-HCC) 04/12/2005  CVA, hemorrhagic - [facility]    PAIN:  Are you having pain? Yes: NPRS scale: 0-1/10 Pain location: L low back Pain description: achy/ stiff Aggravating factors: yardwork Relieving factors: rest  PRECAUTIONS: Fall  WEIGHT BEARING RESTRICTIONS No  FALLS:  Has patient fallen in last 6 months? No  LIVING ENVIRONMENT: Lives with: lives with their family Lives in: House/apartment  OCCUPATION: retired  PLOF: Independent  PATIENT  GOALS Increase LE muscle strength/ improve gait.     OBJECTIVE:   PATIENT SURVEYS:  FOTO initial 53/ goal 54  COGNITION:  Overall cognitive status: Within functional limits for tasks assessed     SENSATION: WFL  MUSCLE LENGTH: Hamstrings: Right 50 deg; Left 50 deg  (moderate hamstring/ gastroc tightness)  POSTURE: rounded shoulders, forward head, and flexed trunk   LOWER EXTREMITY ROM:  B shoulder AROM WFL.  B UE strength grossly 5/5 MMT except L/R shoulder flexion 4/5 MMT.   B LE AROM WFL.    LOWER EXTREMITY MMT:  MMT Right eval Left eval  Hip flexion 4/5 4/5  Hip extension    Hip abduction 4+/5  4+/5  Hip adduction 4+/5  4+/5  Hip internal rotation    Hip external rotation    Knee flexion 5/5 5/5  Knee extension 4+/5 4+/5  Ankle dorsiflexion 5/5 5/5  Ankle plantarflexion    Ankle inversion    Ankle eversion     (Blank rows = not tested)  FUNCTIONAL TESTS:  5 times sit to stand: 12.31 sec./ 11.56 sec.  GAIT: Distance walked: in  clinic Assistive device utilized: None Level of assistance: Complete Independence Comments: Decrease L hip flexion/ step pattern/ heel strike noted.  No LOB during eval.     TODAY'S TREATMENT:  10/24/21:  There.ex.:  Reviewed HEP Nustep L5 10 min. B UE/LE.  HR 85 bpm/ O2 96% 4# ankle wts.: standing hip flexion/ abduction/ extension/ heel raises 20x each.  12" step touches 10x each.  Seated LAQ 20x.     STS 10x with no UE assist.  Pearline Cables chair   Neuro:   6" and 12" hurdles in //-bars 3 laps.  Walking in hallway with alt. UE/LE touches (SBA/CGA for safety)   Walking in hallway with head turns/ identifying letters.     PATIENT EDUCATION:  Education details: Access Code: [email or web address] Person educated: Patient Education method: Explanation, Demonstration, and Verbal cues Education comprehension: verbalized understanding and returned demonstration   HOME EXERCISE PROGRAM: Access Code: [email or web address] URL:  [email or web address] Date: 10/22/2021 Prepared by: Dorcas Carrow   Exercises - Supine Bridge  - 1 x daily - 7 x weekly - 1 sets - 20 reps - Supine March  - 1 x daily - 7 x weekly - 1 sets - 20 reps - Sit to Stand Without Arm Support  - 1 x daily - 7 x weekly - 2 sets - 10 reps - Standing Hip Flexion March  - 1 x daily - 7 x weekly - 1 sets - 20 reps  ASSESSMENT:  CLINICAL IMPRESSION: Patient did very well during tx. Session with few seated rest breaks.  Good technique/ understanding of HEP.  Pt. Benefits from SBA with head turning dynamic activities.  No LOB during hurdles in //-bars with occasional light UE assist.  Pt. Will continue with current HEP.  Pt. Will benefit from skilled PT services to increase LE muscle strength to improve safety/ independence with walking.    OBJECTIVE IMPAIRMENTS Abnormal gait, decreased activity tolerance, decreased balance, decreased coordination, decreased endurance, decreased mobility, difficulty walking, decreased ROM, decreased strength, impaired flexibility, improper body mechanics, and pain.   ACTIVITY LIMITATIONS carrying, lifting, bending, standing, squatting, stairs, and transfers  PARTICIPATION LIMITATIONS: cleaning, community activity, and yard work  PERSONAL FACTORS Age, Fitness, and Past/current experiences are also affecting patient's functional outcome.   REHAB POTENTIAL: Good  CLINICAL DECISION MAKING: Evolving/moderate complexity  EVALUATION COMPLEXITY: Moderate   GOALS: Goals reviewed with patient? Yes  SHORT TERM GOALS: Target date: 11/25/21 Pt. Will increase B hip flexion muscle strength 1/2 muscle grade to improve pain-free mobility  Baseline: see above Goal status: INITIAL   LONG TERM GOALS: Target date: 12/17/21  Pt. Will increase FOTO to 67 to improve pain-free mobility.   Baseline: initial 53 Goal status: INITIAL  2.  Pt. Will decrease 5xSTS to <10 sec. To improve mobility/ decrease fall risk.   Baseline: see above Goal status: INITIAL  3.  Pt. Able to complete 30 minutes of there.ex. with no increase c/o pain to improve walking/ endurance/ mobility Baseline:  pt. Currently not participating with ex. Program.  Goal status: INITIAL    PLAN: PT FREQUENCY: 2x/week  PT DURATION: 8 weeks  PLANNED INTERVENTIONS: Therapeutic exercises, Therapeutic activity, Neuromuscular re-education, Balance training, Gait training, Patient/Family education, Self Care, Joint mobilization, and Manual therapy  PLAN FOR NEXT SESSION: Reassess HEP/ dynamic balance tasks/ LE muscle strengthening  Pura Spice, PT, DPT # [phone] 10/26/2021, 5:25 PM

## 2021-10-29 ENCOUNTER — Encounter: Payer: Self-pay | Admitting: Physical Therapy

## 2021-10-29 ENCOUNTER — Ambulatory Visit: Payer: Medicare Other | Admitting: Physical Therapy

## 2021-10-29 DIAGNOSIS — R269 Unspecified abnormalities of gait and mobility: Secondary | ICD-10-CM

## 2021-10-29 DIAGNOSIS — M545 Low back pain, unspecified: Secondary | ICD-10-CM

## 2021-10-29 DIAGNOSIS — M6281 Muscle weakness (generalized): Secondary | ICD-10-CM

## 2021-10-29 DIAGNOSIS — G8929 Other chronic pain: Secondary | ICD-10-CM

## 2021-10-29 NOTE — Therapy (Signed)
OUTPATIENT PHYSICAL THERAPY LOWER EXTREMITY TREATMENT   Patient Name: Zachary [name] MRN: [id number] DOB:02/16/1946, 76 y.o., male Today's Date: 10/29/2021   PT End of Session - 10/29/21 0859     Visit Number 3    Number of Visits 17    Date for PT Re-Evaluation 12/17/21    PT Start Time 3419    PT Stop Time 0948    PT Time Calculation (min) 54 min    Activity Tolerance Patient tolerated treatment well    Behavior During Therapy [facility] for tasks assessed/performed             Past Medical History:  Diagnosis Date   Asthma    CHF (congestive heart failure) (HCC)    no CPAP   Chronic cough    COPD (chronic obstructive pulmonary disease) (HCC)    Coronary artery disease    Dyspnea    High cholesterol    Hypertension    Melanoma ([place])    Orthopnea    sleeps in a chair   Seasonal allergies    Stroke [facility])    Jan 2007   Past Surgical History:  Procedure Laterality Date   CARDIAC SURGERY     CABG x 5  2007   CATARACT EXTRACTION W/PHACO Left 07/07/2017   Procedure: CATARACT EXTRACTION PHACO AND INTRAOCULAR LENS PLACEMENT ([place]);  Surgeon: Leandrew Koyanagi, MD;  Location: [place];  Service: Ophthalmology;  Laterality: Left;  IVA TOPICAL LEFT   CATARACT EXTRACTION W/PHACO Right 07/28/2017   Procedure: CATARACT EXTRACTION PHACO AND INTRAOCULAR LENS PLACEMENT ([place]) RIGHT;  Surgeon: Leandrew Koyanagi, MD;  Location: [place];  Service: Ophthalmology;  Laterality: Right;   CORONARY ARTERY BYPASS GRAFT     x 5 in 2007   NASAL SINUS SURGERY     UVULECTOMY     Patient Active Problem List   Diagnosis Date Noted   COPD with acute bronchitis ([place]) 09/17/2021   SIRS (systemic inflammatory response syndrome) ([place]) 09/16/2021   Ludwig's angina 10/24/2016   Acute respiratory failure with hypoxia ([place]) 05/06/2015   Acute bronchitis 05/06/2015   Leukocytosis 05/06/2015   Hyperglycemia 05/06/2015   COPD exacerbation ([place]) 05/05/2015    PCP:  Sofie Hartigan, MD  REFERRING PROVIDER: Sofie Hartigan, MD  REFERRING DIAG: Generalized Weakness  THERAPY DIAG:  Muscle weakness (generalized)  Gait difficulty  Chronic left-sided low back pain without sciatica  Rationale for Evaluation and Treatment Rehabilitation  ONSET DATE: 01/30/2021  SUBJECTIVE:   SUBJECTIVE STATEMENT: Pt. Reports L low back discomfort currently (mild ache and 1/10 on pain scale).  Pt. States his L LBP was a 4/10 last Friday after doing bridging ex.  (PT discussed about modifying).  Pt. States he is still coughing up yellow sputum with coughing and is waiting to hear back from MD office.     PERTINENT HISTORY: Zachary [name]., MD - 09/24/2021 1:00 PM EDT Formatting of this note is different from the original. Images from the original note were not included. Zachary [name] is a 76 y.o. male that comes today for the following problem(s):  Chief Complaint  Patient presents with  Hospital Follow Up  weakness   HPI: Patient in the office for follow-up of recent hospitalization for generalized weakness and COPD exacerbation. He was admitted on 09/16/2021 and discharged on 09/18/2021. He was placed on IV vancomycin, Flagyl and cefepime initially and then doxycycline and prednisone orally. He has completed the course of antibiotics and steroids as instructed. He  notes his cough is significantly improved but still present. He notes the sputum was once again yellowish and this is almost cleared. He was previously treated with Levaquin after sputum culture demonstrated Pseudomonas sensitive to quinolones earlier this year. He has followed with cardiology since discharge and a referral to pulmonology at his request was placed. He denies shortness of breath or wheezing currently. He notes his weakness is slightly improved but he still wishes to start physical therapy. He does not wish to do home therapy but wants to come to the office to do therapy and  needs a referral placed. He notes prior to being taken to the hospital he fell out of his chair to the ground and was unable to get himself up. While in the hospital he was noted to have leukocytosis and this improved but did not resolve prior to discharge. He continues to use his [place] and [employer] regularly.  Goals   Maintain health/healthy lifestyle     Patient Active Problem List  Diagnosis  Asthma without status asthmaticus  Allergic rhinitis  S/P coronary artery bypass graft x 5  Essential hypertension  Hyperlipidemia  CVA (cerebral vascular accident) (CMS-HCC)  Apnea, sleep  Chest pain with high risk for cardiac etiology  Impaired glucose tolerance  Seasonal allergic rhinitis due to pollen  COPD with exacerbation (CMS-HCC)  Acute bronchitis  COLD (chronic obstructive lung disease) (CMS-HCC)  Hyperglycemia  Elevated white blood cell count  CAD (coronary artery disease)  Hypertension  SOB (shortness of breath) on exertion  Aspiration of fluid causing abnormal reaction or later complication  Dysphagia, pharyngeal phase  Dysphagia, pharyngoesophageal phase   Past Medical History:  Diagnosis Date  Allergic state  Asthma without status asthmaticus  Bronchitis, chronic (CMS-HCC)  CAD (coronary artery disease)  Cataract cortical, senile  s/p surgical correction  Chronic sinusitis  with polyps  COPD (chronic obstructive pulmonary disease) (CMS-HCC) 2015  Elevated cholesterol  Hypertension  Melanoma (CMS-HCC)  malignant  Pneumonia 2023  Sleep apnea  requiring CPAP which the patient does not wear  Stroke (CMS-HCC) 04/12/2005  CVA, hemorrhagic - [facility]    PAIN:  Are you having pain? Yes: NPRS scale: 1/10 Pain location: L low back Pain description: achy/ stiff Aggravating factors: yardwork Relieving factors: rest  PRECAUTIONS: Fall  WEIGHT BEARING RESTRICTIONS No  FALLS:  Has patient fallen in last 6 months? No  LIVING  ENVIRONMENT: Lives with: lives with their family Lives in: House/apartment  OCCUPATION: retired  PLOF: Independent  PATIENT GOALS Increase LE muscle strength/ improve gait.     OBJECTIVE:   PATIENT SURVEYS:  FOTO initial 53/ goal 61  COGNITION:  Overall cognitive status: Within functional limits for tasks assessed     SENSATION: WFL  MUSCLE LENGTH: Hamstrings: Right 50 deg; Left 50 deg  (moderate hamstring/ gastroc tightness)  POSTURE: rounded shoulders, forward head, and flexed trunk   LOWER EXTREMITY ROM:  B shoulder AROM WFL.  B UE strength grossly 5/5 MMT except L/R shoulder flexion 4/5 MMT.   B LE AROM WFL.    LOWER EXTREMITY MMT:  MMT Right eval Left eval  Hip flexion 4/5 4/5  Hip extension    Hip abduction 4+/5  4+/5  Hip adduction 4+/5  4+/5  Hip internal rotation    Hip external rotation    Knee flexion 5/5 5/5  Knee extension 4+/5 4+/5  Ankle dorsiflexion 5/5 5/5  Ankle plantarflexion    Ankle inversion    Ankle eversion     (  Blank rows = not tested)  FUNCTIONAL TESTS:  5 times sit to stand: 12.31 sec./ 11.56 sec.  GAIT: Distance walked: in clinic Assistive device utilized: None Level of assistance: Complete Independence Comments: Decrease L hip flexion/ step pattern/ heel strike noted.  No LOB during eval.     TODAY'S TREATMENT:  10/29/21:  There.ex.:  Nustep L5 10 min. B UE/LE.  HR 85 bpm/ O2 96%.   12" step ups 10x on L/R with light UE assist on handrails.   BOSU forward/ lateral step ups 5x each with light UE assist.    Resisted gait 2BTB 5x all 4-planes with no UE assist.  Mirror feedback/ good heel and toe strike.  5# ankle wts.: standing hip flexion/ abduction/ extension/ heel raises 20x each.  12" step touches 10x each.  Seated LAQ 20x.     Supine L/R hamstring/ hip/ trunk stretches 3x each.  Supine bolster bridging 20x (no increase L lumbar pain).      PATIENT EDUCATION:  Education details: Access Code:  [email or web address] Person educated: Patient Education method: Explanation, Demonstration, and Verbal cues Education comprehension: verbalized understanding and returned demonstration   HOME EXERCISE PROGRAM: Access Code: [email or web address] URL: [email or web address] Date: 10/22/2021 Prepared by: Dorcas Carrow   Exercises - Supine Bridge  - 1 x daily - 7 x weekly - 1 sets - 20 reps - Supine March  - 1 x daily - 7 x weekly - 1 sets - 20 reps - Sit to Stand Without Arm Support  - 1 x daily - 7 x weekly - 2 sets - 10 reps - Standing Hip Flexion March  - 1 x daily - 7 x weekly - 1 sets - 20 reps  ASSESSMENT:  CLINICAL IMPRESSION: Pt. Reports increase L low back discomfort after standing from a seated position.  Pts. Discomfort resolves quickly after standing.  Good technique/ understanding of HEP and progression to 5# ankle wts.  No LOB during tx. Session and good technique with step ups on 12" step/ BOSU with min. To no UE assist.  Pt. Will continue with current HEP.  Pt. Will benefit from skilled PT services to increase LE muscle strength to improve safety/ independence with walking.    OBJECTIVE IMPAIRMENTS Abnormal gait, decreased activity tolerance, decreased balance, decreased coordination, decreased endurance, decreased mobility, difficulty walking, decreased ROM, decreased strength, impaired flexibility, improper body mechanics, and pain.   ACTIVITY LIMITATIONS carrying, lifting, bending, standing, squatting, stairs, and transfers  PARTICIPATION LIMITATIONS: cleaning, community activity, and yard work  PERSONAL FACTORS Age, Fitness, and Past/current experiences are also affecting patient's functional outcome.   REHAB POTENTIAL: Good  CLINICAL DECISION MAKING: Evolving/moderate complexity  EVALUATION COMPLEXITY: Moderate   GOALS: Goals reviewed with patient? Yes  SHORT TERM GOALS: Target date: 11/25/21 Pt. Will increase B hip flexion muscle strength 1/2 muscle grade to improve  pain-free mobility  Baseline: see above Goal status: INITIAL   LONG TERM GOALS: Target date: 12/17/21  Pt. Will increase FOTO to 67 to improve pain-free mobility.   Baseline: initial 53 Goal status: INITIAL  2.  Pt. Will decrease 5xSTS to <10 sec. To improve mobility/ decrease fall risk.  Baseline: see above Goal status: INITIAL  3.  Pt. Able to complete 30 minutes of there.ex. with no increase c/o pain to improve walking/ endurance/ mobility Baseline:  pt. Currently not participating with ex. Program.  Goal status: INITIAL    PLAN: PT FREQUENCY: 2x/week  PT DURATION: 8 weeks  PLANNED INTERVENTIONS: Therapeutic exercises, Therapeutic activity, Neuromuscular  re-education, Balance training, Gait training, Patient/Family education, Self Care, Joint mobilization, and Manual therapy  PLAN FOR NEXT SESSION: Reassess HEP/ dynamic balance tasks/ LE muscle strengthening  Pura Spice, PT, DPT # [phone] 10/29/2021, 10:04 AM

## 2021-10-31 ENCOUNTER — Ambulatory Visit: Payer: Medicare Other | Attending: Family Medicine | Admitting: Physical Therapy

## 2021-10-31 ENCOUNTER — Encounter: Payer: Self-pay | Admitting: Physical Therapy

## 2021-10-31 DIAGNOSIS — M545 Low back pain, unspecified: Secondary | ICD-10-CM | POA: Insufficient documentation

## 2021-10-31 DIAGNOSIS — R269 Unspecified abnormalities of gait and mobility: Secondary | ICD-10-CM | POA: Insufficient documentation

## 2021-10-31 DIAGNOSIS — G8929 Other chronic pain: Secondary | ICD-10-CM | POA: Insufficient documentation

## 2021-10-31 DIAGNOSIS — M6281 Muscle weakness (generalized): Secondary | ICD-10-CM | POA: Insufficient documentation

## 2021-10-31 NOTE — Therapy (Signed)
OUTPATIENT PHYSICAL THERAPY LOWER EXTREMITY TREATMENT   Patient Name: Zachary [name] MRN: [id number] DOB:1945/05/30, 76 y.o., male Today's Date: 10/31/2021   PT End of Session - 10/31/21 0856     Visit Number 4    Number of Visits 17    Date for PT Re-Evaluation 12/17/21    PT Start Time 0856    PT Stop Time 0944    PT Time Calculation (min) 48 min    Activity Tolerance Patient tolerated treatment well    Behavior During Therapy [facility] for tasks assessed/performed             Past Medical History:  Diagnosis Date   Asthma    CHF (congestive heart failure) (HCC)    no CPAP   Chronic cough    COPD (chronic obstructive pulmonary disease) (HCC)    Coronary artery disease    Dyspnea    High cholesterol    Hypertension    Melanoma ([place])    Orthopnea    sleeps in a chair   Seasonal allergies    Stroke [facility])    Jan 2007   Past Surgical History:  Procedure Laterality Date   CARDIAC SURGERY     CABG x 5  2007   CATARACT EXTRACTION W/PHACO Left 07/07/2017   Procedure: CATARACT EXTRACTION PHACO AND INTRAOCULAR LENS PLACEMENT ([place]);  Surgeon: Zachary Koyanagi, MD;  Location: [place];  Service: Ophthalmology;  Laterality: Left;  IVA TOPICAL LEFT   CATARACT EXTRACTION W/PHACO Right 07/28/2017   Procedure: CATARACT EXTRACTION PHACO AND INTRAOCULAR LENS PLACEMENT ([place]) RIGHT;  Surgeon: Zachary Koyanagi, MD;  Location: [place];  Service: Ophthalmology;  Laterality: Right;   CORONARY ARTERY BYPASS GRAFT     x 5 in 2007   NASAL SINUS SURGERY     UVULECTOMY     Patient Active Problem List   Diagnosis Date Noted   COPD with acute bronchitis ([place]) 09/17/2021   SIRS (systemic inflammatory response syndrome) ([place]) 09/16/2021   Ludwig's angina 10/24/2016   Acute respiratory failure with hypoxia ([place]) 05/06/2015   Acute bronchitis 05/06/2015   Leukocytosis 05/06/2015   Hyperglycemia 05/06/2015   COPD exacerbation ([place]) 05/05/2015    PCP:  Sofie Hartigan, MD  REFERRING PROVIDER: Sofie Hartigan, MD  REFERRING DIAG: Generalized Weakness  THERAPY DIAG:  Muscle weakness (generalized)  Gait difficulty  Chronic left-sided low back pain without sciatica  Rationale for Evaluation and Treatment Rehabilitation  ONSET DATE: 01/30/2021  SUBJECTIVE:   SUBJECTIVE STATEMENT: Pt. Reports L low back aching and states he was tired after last PT tx. Session.  Pt. Reports his coughing is doing a little better the past couple days.    PERTINENT HISTORY: [name]., MD - 09/24/2021 1:00 PM EDT Formatting of this note is different from the original. Images from the original note were not included. [name] is a 76 y.o. male that comes today for the following problem(s):  Chief Complaint  Patient presents with  Hospital Follow Up  weakness   HPI: Patient in the office for follow-up of recent hospitalization for generalized weakness and COPD exacerbation. He was admitted on 09/16/2021 and discharged on 09/18/2021. He was placed on IV vancomycin, Flagyl and cefepime initially and then doxycycline and prednisone orally. He has completed the course of antibiotics and steroids as instructed. He notes his cough is significantly improved but still present. He notes the sputum was once again yellowish and this is almost cleared. He was previously treated  with Levaquin after sputum culture demonstrated Pseudomonas sensitive to quinolones earlier this year. He has followed with cardiology since discharge and a referral to pulmonology at his request was placed. He denies shortness of breath or wheezing currently. He notes his weakness is slightly improved but he still wishes to start physical therapy. He does not wish to do home therapy but wants to come to the office to do therapy and needs a referral placed. He notes prior to being taken to the hospital he fell out of his chair to the ground and was unable to get himself  up. While in the hospital he was noted to have leukocytosis and this improved but did not resolve prior to discharge. He continues to use his [place] and [employer] regularly.  Goals   Maintain health/healthy lifestyle     Patient Active Problem List  Diagnosis  Asthma without status asthmaticus  Allergic rhinitis  S/P coronary artery bypass graft x 5  Essential hypertension  Hyperlipidemia  CVA (cerebral vascular accident) (CMS-HCC)  Apnea, sleep  Chest pain with high risk for cardiac etiology  Impaired glucose tolerance  Seasonal allergic rhinitis due to pollen  COPD with exacerbation (CMS-HCC)  Acute bronchitis  COLD (chronic obstructive lung disease) (CMS-HCC)  Hyperglycemia  Elevated white blood cell count  CAD (coronary artery disease)  Hypertension  SOB (shortness of breath) on exertion  Aspiration of fluid causing abnormal reaction or later complication  Dysphagia, pharyngeal phase  Dysphagia, pharyngoesophageal phase   Past Medical History:  Diagnosis Date  Allergic state  Asthma without status asthmaticus  Bronchitis, chronic (CMS-HCC)  CAD (coronary artery disease)  Cataract cortical, senile  s/p surgical correction  Chronic sinusitis  with polyps  COPD (chronic obstructive pulmonary disease) (CMS-HCC) 2015  Elevated cholesterol  Hypertension  Melanoma (CMS-HCC)  malignant  Pneumonia 2023  Sleep apnea  requiring CPAP which the patient does not wear  Stroke (CMS-HCC) 04/12/2005  CVA, hemorrhagic - [facility]    PAIN:  Are you having pain? Yes: NPRS scale: 1/10 Pain location: L low back Pain description: achy/ stiff Aggravating factors: yardwork Relieving factors: rest  PRECAUTIONS: Fall  WEIGHT BEARING RESTRICTIONS No  FALLS:  Has patient fallen in last 6 months? No  LIVING ENVIRONMENT: Lives with: lives with their family Lives in: House/apartment  OCCUPATION: retired  PLOF: Independent  PATIENT GOALS Increase LE muscle  strength/ improve gait.     OBJECTIVE:   PATIENT SURVEYS:  FOTO initial 53/ goal 33  COGNITION:  Overall cognitive status: Within functional limits for tasks assessed     SENSATION: WFL  MUSCLE LENGTH: Hamstrings: Right 50 deg; Left 50 deg  (moderate hamstring/ gastroc tightness)  POSTURE: rounded shoulders, forward head, and flexed trunk   LOWER EXTREMITY ROM:  B shoulder AROM WFL.  B UE strength grossly 5/5 MMT except L/R shoulder flexion 4/5 MMT.   B LE AROM WFL.    LOWER EXTREMITY MMT:  MMT Right eval Left eval  Hip flexion 4/5 4/5  Hip extension    Hip abduction 4+/5  4+/5  Hip adduction 4+/5  4+/5  Hip internal rotation    Hip external rotation    Knee flexion 5/5 5/5  Knee extension 4+/5 4+/5  Ankle dorsiflexion 5/5 5/5  Ankle plantarflexion    Ankle inversion    Ankle eversion     (Blank rows = not tested)  FUNCTIONAL TESTS:  5 times sit to stand: 12.31 sec./ 11.56 sec.  GAIT: Distance walked: in clinic  Assistive device utilized: None Level of assistance: Complete Independence Comments: Decrease L hip flexion/ step pattern/ heel strike noted.  No LOB during eval.     TODAY'S TREATMENT:  10/31/21:  There.ex.:  Nustep L5 10 min. B UE/LE.  HR 83 bpm/ O2 96%.   BOSU forward/ lateral step ups 5x each with light UE assist.  Reverse BOSU: wt. Shifting/ partial squats (light UE assist for safety)- SBA/CGA for safety and verbal cuing to correct posture.   Resisted gait at [place] (140#):  6x forward/ backwards with no UE assist.  Good heel and toe strike.  SBA for safety/cuing  12" step ups 10x on L/R with light UE assist on handrails.       Supine L/R hamstring/ hip/ trunk stretches 3x each.     No change to HEP.     PATIENT EDUCATION:  Education details: Access Code: [email or web address] Person educated: Patient Education method: Explanation, Demonstration, and Verbal cues Education comprehension: verbalized understanding and returned  demonstration   HOME EXERCISE PROGRAM: Access Code: [email or web address] URL: [email or web address] Date: 10/22/2021 Prepared by: Dorcas Carrow   Exercises - Supine Bridge  - 1 x daily - 7 x weekly - 1 sets - 20 reps - Supine March  - 1 x daily - 7 x weekly - 1 sets - 20 reps - Sit to Stand Without Arm Support  - 1 x daily - 7 x weekly - 2 sets - 10 reps - Standing Hip Flexion March  - 1 x daily - 7 x weekly - 1 sets - 20 reps  ASSESSMENT:  CLINICAL IMPRESSION: No LOB with resisted gait/ BOSU step ups but light UE assist required to self-correct in //-bars.   Pt. Progressing well with resisted/ strengthening ex. Program but limited with L lumbar discomfort.  Moderate hamstring/ piriformis/ lumbar muscle tightness during supine stretches.   Pt. Will continue with current HEP.  Pt. Will benefit from skilled PT services to increase LE muscle strength to improve safety/ independence with walking.    OBJECTIVE IMPAIRMENTS Abnormal gait, decreased activity tolerance, decreased balance, decreased coordination, decreased endurance, decreased mobility, difficulty walking, decreased ROM, decreased strength, impaired flexibility, improper body mechanics, and pain.   ACTIVITY LIMITATIONS carrying, lifting, bending, standing, squatting, stairs, and transfers  PARTICIPATION LIMITATIONS: cleaning, community activity, and yard work  PERSONAL FACTORS Age, Fitness, and Past/current experiences are also affecting patient's functional outcome.   REHAB POTENTIAL: Good  CLINICAL DECISION MAKING: Evolving/moderate complexity  EVALUATION COMPLEXITY: Moderate   GOALS: Goals reviewed with patient? Yes  SHORT TERM GOALS: Target date: 11/25/21 Pt. Will increase B hip flexion muscle strength 1/2 muscle grade to improve pain-free mobility  Baseline: see above Goal status: INITIAL   LONG TERM GOALS: Target date: 12/17/21  Pt. Will increase FOTO to 67 to improve pain-free mobility.   Baseline:  initial 53 Goal status: INITIAL  2.  Pt. Will decrease 5xSTS to <10 sec. To improve mobility/ decrease fall risk.  Baseline: see above Goal status: INITIAL  3.  Pt. Able to complete 30 minutes of there.ex. with no increase c/o pain to improve walking/ endurance/ mobility Baseline:  pt. Currently not participating with ex. Program.  Goal status: INITIAL    PLAN: PT FREQUENCY: 2x/week  PT DURATION: 8 weeks  PLANNED INTERVENTIONS: Therapeutic exercises, Therapeutic activity, Neuromuscular re-education, Balance training, Gait training, Patient/Family education, Self Care, Joint mobilization, and Manual therapy  PLAN FOR NEXT SESSION: Reassess HEP/ dynamic balance tasks/ LE muscle strengthening  Pura Spice, PT, DPT #  [id number] 10/31/2021, 10:39 AM

## 2021-11-05 ENCOUNTER — Encounter: Payer: Self-pay | Admitting: Physical Therapy

## 2021-11-05 ENCOUNTER — Ambulatory Visit: Payer: Medicare Other | Admitting: Physical Therapy

## 2021-11-05 DIAGNOSIS — M6281 Muscle weakness (generalized): Secondary | ICD-10-CM | POA: Diagnosis not present

## 2021-11-05 DIAGNOSIS — G8929 Other chronic pain: Secondary | ICD-10-CM

## 2021-11-05 DIAGNOSIS — R269 Unspecified abnormalities of gait and mobility: Secondary | ICD-10-CM

## 2021-11-05 NOTE — Therapy (Signed)
OUTPATIENT PHYSICAL THERAPY LOWER EXTREMITY TREATMENT   Patient Name: NILO FALLIN MRN: [id number] DOB:1945-07-13, 76 y.o., male Today's Date: 11/05/2021   PT End of Session - 11/05/21 0859     Visit Number 5    Number of Visits 17    Date for PT Re-Evaluation 12/17/21    PT Start Time 0859    PT Stop Time 0946    PT Time Calculation (min) 47 min    Activity Tolerance Patient tolerated treatment well    Behavior During Therapy [facility] for tasks assessed/performed             Past Medical History:  Diagnosis Date   Asthma    CHF (congestive heart failure) ([place])    no CPAP   Chronic cough    COPD (chronic obstructive pulmonary disease) ([place])    Coronary artery disease    Dyspnea    High cholesterol    Hypertension    Melanoma ([place])    Orthopnea    sleeps in a chair   Seasonal allergies    Stroke [facility])    Jan 2007   Past Surgical History:  Procedure Laterality Date   CARDIAC SURGERY     CABG x 5  2007   CATARACT EXTRACTION W/PHACO Left 07/07/2017   Procedure: CATARACT EXTRACTION PHACO AND INTRAOCULAR LENS PLACEMENT ([place]);  Surgeon: Leandrew Koyanagi, MD;  Location: [place];  Service: Ophthalmology;  Laterality: Left;  IVA TOPICAL LEFT   CATARACT EXTRACTION W/PHACO Right 07/28/2017   Procedure: CATARACT EXTRACTION PHACO AND INTRAOCULAR LENS PLACEMENT ([place]) RIGHT;  Surgeon: Leandrew Koyanagi, MD;  Location: [place];  Service: Ophthalmology;  Laterality: Right;   CORONARY ARTERY BYPASS GRAFT     x 5 in 2007   NASAL SINUS SURGERY     UVULECTOMY     Patient Active Problem List   Diagnosis Date Noted   COPD with acute bronchitis ([place]) 09/17/2021   SIRS (systemic inflammatory response syndrome) ([place]) 09/16/2021   Ludwig's angina 10/24/2016   Acute respiratory failure with hypoxia ([place]) 05/06/2015   Acute bronchitis 05/06/2015   Leukocytosis 05/06/2015   Hyperglycemia 05/06/2015   COPD exacerbation ([place]) 05/05/2015    PCP:  Sofie Hartigan, MD  REFERRING PROVIDER: Sofie Hartigan, MD  REFERRING DIAG: Generalized Weakness  THERAPY DIAG:  Muscle weakness (generalized)  Gait difficulty  Chronic left-sided low back pain without sciatica  Rationale for Evaluation and Treatment Rehabilitation  ONSET DATE: 01/30/2021  SUBJECTIVE:   SUBJECTIVE STATEMENT: Pt. Reports no low back pain prior to PT tx. Session.  Pt. Reports improvement with putting on pants since starting PT.    PERTINENT HISTORY: [name]., MD - 09/24/2021 1:00 PM EDT Formatting of this note is different from the original. Images from the original note were not included. RASHIDI LOH is a 76 y.o. male that comes today for the following problem(s):  Chief Complaint  Patient presents with  Hospital Follow Up  weakness   HPI: Patient in the office for follow-up of recent hospitalization for generalized weakness and COPD exacerbation. He was admitted on 09/16/2021 and discharged on 09/18/2021. He was placed on IV vancomycin, Flagyl and cefepime initially and then doxycycline and prednisone orally. He has completed the course of antibiotics and steroids as instructed. He notes his cough is significantly improved but still present. He notes the sputum was once again yellowish and this is almost cleared. He was previously treated with Levaquin after sputum culture demonstrated Pseudomonas sensitive  to quinolones earlier this year. He has followed with cardiology since discharge and a referral to pulmonology at his request was placed. He denies shortness of breath or wheezing currently. He notes his weakness is slightly improved but he still wishes to start physical therapy. He does not wish to do home therapy but wants to come to the office to do therapy and needs a referral placed. He notes prior to being taken to the hospital he fell out of his chair to the ground and was unable to get himself up. While in the hospital he was  noted to have leukocytosis and this improved but did not resolve prior to discharge. He continues to use his [place] and [employer] regularly.  Goals   Maintain health/healthy lifestyle     Patient Active Problem List  Diagnosis  Asthma without status asthmaticus  Allergic rhinitis  S/P coronary artery bypass graft x 5  Essential hypertension  Hyperlipidemia  CVA (cerebral vascular accident) (CMS-HCC)  Apnea, sleep  Chest pain with high risk for cardiac etiology  Impaired glucose tolerance  Seasonal allergic rhinitis due to pollen  COPD with exacerbation (CMS-HCC)  Acute bronchitis  COLD (chronic obstructive lung disease) (CMS-HCC)  Hyperglycemia  Elevated white blood cell count  CAD (coronary artery disease)  Hypertension  SOB (shortness of breath) on exertion  Aspiration of fluid causing abnormal reaction or later complication  Dysphagia, pharyngeal phase  Dysphagia, pharyngoesophageal phase   Past Medical History:  Diagnosis Date  Allergic state  Asthma without status asthmaticus  Bronchitis, chronic (CMS-HCC)  CAD (coronary artery disease)  Cataract cortical, senile  s/p surgical correction  Chronic sinusitis  with polyps  COPD (chronic obstructive pulmonary disease) (CMS-HCC) 2015  Elevated cholesterol  Hypertension  Melanoma (CMS-HCC)  malignant  Pneumonia 2023  Sleep apnea  requiring CPAP which the patient does not wear  Stroke (CMS-HCC) 04/12/2005  CVA, hemorrhagic - [facility]    PAIN:  Are you having pain? Yes: NPRS scale: 1/10 Pain location: L low back Pain description: achy/ stiff Aggravating factors: yardwork Relieving factors: rest  PRECAUTIONS: Fall  WEIGHT BEARING RESTRICTIONS No  FALLS:  Has patient fallen in last 6 months? No  LIVING ENVIRONMENT: Lives with: lives with their family Lives in: House/apartment  OCCUPATION: retired  PLOF: Independent  PATIENT GOALS Increase LE muscle strength/ improve gait.      OBJECTIVE:   PATIENT SURVEYS:  FOTO initial 53/ goal 91  COGNITION:  Overall cognitive status: Within functional limits for tasks assessed     SENSATION: WFL  MUSCLE LENGTH: Hamstrings: Right 50 deg; Left 50 deg  (moderate hamstring/ gastroc tightness)  POSTURE: rounded shoulders, forward head, and flexed trunk   LOWER EXTREMITY ROM:  B shoulder AROM WFL.  B UE strength grossly 5/5 MMT except L/R shoulder flexion 4/5 MMT.   B LE AROM WFL.    LOWER EXTREMITY MMT:  MMT Right eval Left eval  Hip flexion 4/5 4/5  Hip extension    Hip abduction 4+/5  4+/5  Hip adduction 4+/5  4+/5  Hip internal rotation    Hip external rotation    Knee flexion 5/5 5/5  Knee extension 4+/5 4+/5  Ankle dorsiflexion 5/5 5/5  Ankle plantarflexion    Ankle inversion    Ankle eversion     (Blank rows = not tested)  FUNCTIONAL TESTS:  5 times sit to stand: 12.31 sec./ 11.56 sec.  GAIT: Distance walked: in clinic Assistive device utilized: None Level of assistance: Complete  Independence Comments: Decrease L hip flexion/ step pattern/ heel strike noted.  No LOB during eval.     TODAY'S TREATMENT:  11/05/21:  There.ex.:  Nustep L5 10 min. B UE/LE.  HR 91 bpm/ O2 97%.   Resisted gait at [employer] (140#):  5x forward/ backwards with no UE assist.  125# lateral walking 5x each.  Good heel and toe strike.  SBA for safety/cuing  Walking in hallway with added alt. UE/LE touches/ sled push and pull 40#/  floor to waist box lifting and B carrying 17# 45 feet x 4.  BOSU forward step ups 5x each with light UE assist.  6"/12" step ups 10x on L/R with no UE assist.    Walking outside on grassy terrain/ small hills/ curbs/ parking lot with consistent recip. Gait pattern with arm swing.        PATIENT EDUCATION:  Education details: Access Code: [email or web address] Person educated: Patient Education method: Explanation, Demonstration, and Verbal cues Education comprehension: verbalized  understanding and returned demonstration   HOME EXERCISE PROGRAM: Access Code: [email or web address] URL: [email or web address] Date: 10/22/2021 Prepared by: Dorcas Carrow   Exercises - Supine Bridge  - 1 x daily - 7 x weekly - 1 sets - 20 reps - Supine March  - 1 x daily - 7 x weekly - 1 sets - 20 reps - Sit to Stand Without Arm Support  - 1 x daily - 7 x weekly - 2 sets - 10 reps - Standing Hip Flexion March  - 1 x daily - 7 x weekly - 1 sets - 20 reps  ASSESSMENT:  CLINICAL IMPRESSION: Pt. Able to complete 12" step ups with no UE assist.  Good ankle/knee control with BOSU step ups and no LOB.  Pt. Progressing well with resisted/ strengthening ex. Program with no c/o low back pain.  No supine stretches today.  Pt. Will continue with current HEP.  Pt. Will benefit from skilled PT services to increase LE muscle strength to improve safety/ independence with walking.    OBJECTIVE IMPAIRMENTS Abnormal gait, decreased activity tolerance, decreased balance, decreased coordination, decreased endurance, decreased mobility, difficulty walking, decreased ROM, decreased strength, impaired flexibility, improper body mechanics, and pain.   ACTIVITY LIMITATIONS carrying, lifting, bending, standing, squatting, stairs, and transfers  PARTICIPATION LIMITATIONS: cleaning, community activity, and yard work  PERSONAL FACTORS Age, Fitness, and Past/current experiences are also affecting patient's functional outcome.   REHAB POTENTIAL: Good  CLINICAL DECISION MAKING: Evolving/moderate complexity  EVALUATION COMPLEXITY: Moderate   GOALS: Goals reviewed with patient? Yes  SHORT TERM GOALS: Target date: 11/25/21 Pt. Will increase B hip flexion muscle strength 1/2 muscle grade to improve pain-free mobility  Baseline: see above Goal status: INITIAL   LONG TERM GOALS: Target date: 12/17/21  Pt. Will increase FOTO to 67 to improve pain-free mobility.   Baseline: initial 53 Goal status:  INITIAL  2.  Pt. Will decrease 5xSTS to <10 sec. To improve mobility/ decrease fall risk.  Baseline: see above Goal status: INITIAL  3.  Pt. Able to complete 30 minutes of there.ex. with no increase c/o pain to improve walking/ endurance/ mobility Baseline:  pt. Currently not participating with ex. Program.  Goal status: INITIAL    PLAN: PT FREQUENCY: 2x/week  PT DURATION: 8 weeks  PLANNED INTERVENTIONS: Therapeutic exercises, Therapeutic activity, Neuromuscular re-education, Balance training, Gait training, Patient/Family education, Self Care, Joint mobilization, and Manual therapy  PLAN FOR NEXT SESSION: Reassess HEP/ dynamic balance tasks/ LE muscle strengthening.  CHECK GOALS  Rebeca Alert  Clementeen Hoof, PT, DPT # [phone] 11/05/2021, 10:02 AM

## 2021-11-07 ENCOUNTER — Ambulatory Visit: Payer: Medicare Other | Admitting: Physical Therapy

## 2021-11-07 ENCOUNTER — Encounter: Payer: Self-pay | Admitting: Physical Therapy

## 2021-11-07 DIAGNOSIS — M6281 Muscle weakness (generalized): Secondary | ICD-10-CM | POA: Diagnosis not present

## 2021-11-07 DIAGNOSIS — G8929 Other chronic pain: Secondary | ICD-10-CM

## 2021-11-07 DIAGNOSIS — R269 Unspecified abnormalities of gait and mobility: Secondary | ICD-10-CM

## 2021-11-07 NOTE — Therapy (Signed)
OUTPATIENT PHYSICAL THERAPY LOWER EXTREMITY TREATMENT   Patient Name: Zachary [name] MRN: [id number] DOB:July 29, 1945, 76 y.o., male Today's Date: 11/07/2021   PT End of Session - 11/07/21 0903     Visit Number 6    Number of Visits 17    Date for PT Re-Evaluation 12/17/21    PT Start Time 0903    PT Stop Time 0949    PT Time Calculation (min) 46 min    Activity Tolerance Patient tolerated treatment well    Behavior During Therapy [facility] for tasks assessed/performed             Past Medical History:  Diagnosis Date   Asthma    CHF (congestive heart failure) ([place])    no CPAP   Chronic cough    COPD (chronic obstructive pulmonary disease) ([place])    Coronary artery disease    Dyspnea    High cholesterol    Hypertension    Melanoma ([place])    Orthopnea    sleeps in a chair   Seasonal allergies    Stroke [facility])    Jan 2007   Past Surgical History:  Procedure Laterality Date   CARDIAC SURGERY     CABG x 5  2007   CATARACT EXTRACTION W/PHACO Left 07/07/2017   Procedure: CATARACT EXTRACTION PHACO AND INTRAOCULAR LENS PLACEMENT ([place]);  Surgeon: Leandrew Koyanagi, MD;  Location: [place];  Service: Ophthalmology;  Laterality: Left;  IVA TOPICAL LEFT   CATARACT EXTRACTION W/PHACO Right 07/28/2017   Procedure: CATARACT EXTRACTION PHACO AND INTRAOCULAR LENS PLACEMENT ([place]) RIGHT;  Surgeon: Leandrew Koyanagi, MD;  Location: [place];  Service: Ophthalmology;  Laterality: Right;   CORONARY ARTERY BYPASS GRAFT     x 5 in 2007   NASAL SINUS SURGERY     UVULECTOMY     Patient Active Problem List   Diagnosis Date Noted   COPD with acute bronchitis ([place]) 09/17/2021   SIRS (systemic inflammatory response syndrome) ([place]) 09/16/2021   Ludwig's angina 10/24/2016   Acute respiratory failure with hypoxia ([place]) 05/06/2015   Acute bronchitis 05/06/2015   Leukocytosis 05/06/2015   Hyperglycemia 05/06/2015   COPD exacerbation ([place]) 05/05/2015    PCP:  Sofie Hartigan, MD  REFERRING PROVIDER: Sofie Hartigan, MD  REFERRING DIAG: Generalized Weakness  THERAPY DIAG:  Muscle weakness (generalized)  Gait difficulty  Chronic left-sided low back pain without sciatica  Rationale for Evaluation and Treatment Rehabilitation  ONSET DATE: 01/30/2021  SUBJECTIVE:   SUBJECTIVE STATEMENT: Pt. Reports no low back pain prior to PT tx. Session.  Pt./PT discussed [employer] ex. Program (returning 2-3x/week).     PERTINENT HISTORY: Zachary [name]., MD - 09/24/2021 1:00 PM EDT Formatting of this note is different from the original. Images from the original note were not included. Zachary [name] is a 76 y.o. male that comes today for the following problem(s):  Chief Complaint  Patient presents with  Hospital Follow Up  weakness   HPI: Patient in the office for follow-up of recent hospitalization for generalized weakness and COPD exacerbation. He was admitted on 09/16/2021 and discharged on 09/18/2021. He was placed on IV vancomycin, Flagyl and cefepime initially and then doxycycline and prednisone orally. He has completed the course of antibiotics and steroids as instructed. He notes his cough is significantly improved but still present. He notes the sputum was once again yellowish and this is almost cleared. He was previously treated with Levaquin after sputum culture demonstrated Pseudomonas sensitive to  quinolones earlier this year. He has followed with cardiology since discharge and a referral to pulmonology at his request was placed. He denies shortness of breath or wheezing currently. He notes his weakness is slightly improved but he still wishes to start physical therapy. He does not wish to do home therapy but wants to come to the office to do therapy and needs a referral placed. He notes prior to being taken to the hospital he fell out of his chair to the ground and was unable to get himself up. While in the hospital he  was noted to have leukocytosis and this improved but did not resolve prior to discharge. He continues to use his [place] and [employer] regularly.  Goals   Maintain health/healthy lifestyle   Patient Active Problem List  Diagnosis  Asthma without status asthmaticus  Allergic rhinitis  S/P coronary artery bypass graft x 5  Essential hypertension  Hyperlipidemia  CVA (cerebral vascular accident) (CMS-HCC)  Apnea, sleep  Chest pain with high risk for cardiac etiology  Impaired glucose tolerance  Seasonal allergic rhinitis due to pollen  COPD with exacerbation (CMS-HCC)  Acute bronchitis  COLD (chronic obstructive lung disease) (CMS-HCC)  Hyperglycemia  Elevated white blood cell count  CAD (coronary artery disease)  Hypertension  SOB (shortness of breath) on exertion  Aspiration of fluid causing abnormal reaction or later complication  Dysphagia, pharyngeal phase  Dysphagia, pharyngoesophageal phase   Past Medical History:  Diagnosis Date  Allergic state  Asthma without status asthmaticus  Bronchitis, chronic (CMS-HCC)  CAD (coronary artery disease)  Cataract cortical, senile  s/p surgical correction  Chronic sinusitis  with polyps  COPD (chronic obstructive pulmonary disease) (CMS-HCC) 2015  Elevated cholesterol  Hypertension  Melanoma (CMS-HCC)  malignant  Pneumonia 2023  Sleep apnea  requiring CPAP which the patient does not wear  Stroke (CMS-HCC) 04/12/2005  CV[facility], hemorrhagic - [facility]    PAIN:  Are you having pain? Yes: NPRS scale: 1/10 Pain location: L low back Pain description: achy/ stiff Aggravating factors: yardwork Relieving factors: rest  PRECAUTIONS: Fall  WEIGHT BEARING RESTRICTIONS No  FALLS:  Has patient fallen in last 6 months? No  LIVING ENVIRONMENT: Lives with: lives with their family Lives in: House/apartment  OCCUPATION: retired  PLOF: Independent  PATIENT GOALS Increase LE muscle strength/ improve gait.      OBJECTIVE:   PATIENT SURVEYS:  FOTO initial 53/ goal 87  COGNITION:  Overall cognitive status: Within functional limits for tasks assessed     SENSATION: WFL  MUSCLE LENGTH: Hamstrings: Right 50 deg; Left 50 deg  (moderate hamstring/ gastroc tightness)  POSTURE: rounded shoulders, forward head, and flexed trunk   LOWER EXTREMITY ROM:  B shoulder AROM WFL.  B UE strength grossly 5/5 MMT except L/R shoulder flexion 4/5 MMT.   B LE AROM WFL.    LOWER EXTREMITY MMT:  MMT Right eval Left eval  Hip flexion 4/5 4/5  Hip extension    Hip abduction 4+/5  4+/5  Hip adduction 4+/5  4+/5  Hip internal rotation    Hip external rotation    Knee flexion 5/5 5/5  Knee extension 4+/5 4+/5  Ankle dorsiflexion 5/5 5/5  Ankle plantarflexion    Ankle inversion    Ankle eversion     (Blank rows = not tested)  FUNCTIONAL TESTS:  5 times sit to stand: 12.31 sec./ 11.56 sec.  GAIT: Distance walked: in clinic Assistive device utilized: None Level of assistance: Complete Independence Comments: Decrease  L hip flexion/ step pattern/ heel strike noted.  No LOB during eval.     TODAY'S TREATMENT:  11/07/21:  There.ex.:  Nustep L6 10 min. B UE/LE.  HR 84 bpm/ O2 97%.   Discussed returning to [employer] 2-3 days/week.  Seated/ standing 5# ankle wt. Ex. (LAQ/ marching/ recip. Stairs/ hip abduction/ hip ext./ hamstring curls/ heel raises 20x.    Resisted gait at [employer] (140#):  5x forward/ backwards with no UE assist.  125# lateral walking 5x each.  Good heel and toe strike.  SBA for safety/cuing  Walking in hallway with added alt. UE/LE touches/ lateral braiding with cuing to increase upright posture 2 laps.    BOSU forward step ups 5x each with light UE assist.  Cuing to prevent posterior lean/ correct upright posture.    Walking outside around building with consistent recip. Gait pattern and arm swing.       PATIENT EDUCATION:  Education details: Access Code:  [email or web address] Person educated: Patient Education method: Explanation, Demonstration, and Verbal cues Education comprehension: verbalized understanding and returned demonstration   HOME EXERCISE PROGRAM: Access Code: [email or web address] URL: [email or web address] Date: 10/22/2021 Prepared by: Dorcas Carrow   Exercises - Supine Bridge  - 1 x daily - 7 x weekly - 1 sets - 20 reps - Supine March  - 1 x daily - 7 x weekly - 1 sets - 20 reps - Sit to Stand Without Arm Support  - 1 x daily - 7 x weekly - 2 sets - 10 reps - Standing Hip Flexion March  - 1 x daily - 7 x weekly - 1 sets - 20 reps  ASSESSMENT:  CLINICAL IMPRESSION: Pt. Able to complete 12" step ups with no UE assist.  Good ankle/knee control with BOSU step ups and no LOB.  Pt. Progressing well with resisted/ strengthening ex. Program with no c/o low back pain.  No supine stretches today.  Pt. Will continue with current HEP.  Pt. Will benefit from skilled PT services to increase LE muscle strength to improve safety/ independence with walking.    OBJECTIVE IMPAIRMENTS Abnormal gait, decreased activity tolerance, decreased balance, decreased coordination, decreased endurance, decreased mobility, difficulty walking, decreased ROM, decreased strength, impaired flexibility, improper body mechanics, and pain.   ACTIVITY LIMITATIONS carrying, lifting, bending, standing, squatting, stairs, and transfers  PARTICIPATION LIMITATIONS: cleaning, community activity, and yard work  PERSONAL FACTORS Age, Fitness, and Past/current experiences are also affecting patient's functional outcome.   REHAB POTENTIAL: Good  CLINICAL DECISION MAKING: Evolving/moderate complexity  EVALUATION COMPLEXITY: Moderate   GOALS: Goals reviewed with patient? Yes  SHORT TERM GOALS: Target date: 11/25/21 Pt. Will increase B hip flexion muscle strength 1/2 muscle grade to improve pain-free mobility  Baseline: see above Goal status: INITIAL   LONG TERM  GOALS: Target date: 12/17/21  Pt. Will increase FOTO to 67 to improve pain-free mobility.   Baseline: initial 53 Goal status: INITIAL  2.  Pt. Will decrease 5xSTS to <10 sec. To improve mobility/ decrease fall risk.  Baseline: see above Goal status: INITIAL  3.  Pt. Able to complete 30 minutes of there.ex. with no increase c/o pain to improve walking/ endurance/ mobility Baseline:  pt. Currently not participating with ex. Program.  Goal status: INITIAL  PLAN: PT FREQUENCY: 2x/week  PT DURATION: 8 weeks  PLANNED INTERVENTIONS: Therapeutic exercises, Therapeutic activity, Neuromuscular re-education, Balance training, Gait training, Patient/Family education, Self Care, Joint mobilization, and Manual therapy  PLAN FOR NEXT SESSION: CHECK GOALS  Rebeca Alert  Clementeen Hoof, PT, DPT # [phone] 11/07/2021, 10:37 AM

## 2021-11-12 ENCOUNTER — Ambulatory Visit: Payer: Medicare Other | Admitting: Physical Therapy

## 2021-11-12 ENCOUNTER — Encounter: Payer: Self-pay | Admitting: Physical Therapy

## 2021-11-12 DIAGNOSIS — R269 Unspecified abnormalities of gait and mobility: Secondary | ICD-10-CM

## 2021-11-12 DIAGNOSIS — M6281 Muscle weakness (generalized): Secondary | ICD-10-CM

## 2021-11-12 DIAGNOSIS — G8929 Other chronic pain: Secondary | ICD-10-CM

## 2021-11-12 NOTE — Therapy (Signed)
OUTPATIENT PHYSICAL THERAPY LOWER EXTREMITY TREATMENT   Patient Name: Zachary [name] MRN: [id number] DOB:May 08, 1945, 76 y.o., male Today's Date: 11/12/2021   PT End of Session - 11/12/21 0904     Visit Number 7    Number of Visits 17    Date for PT Re-Evaluation 12/17/21    PT Start Time 0856    PT Stop Time 0946    PT Time Calculation (min) 50 min    Activity Tolerance Patient tolerated treatment well    Behavior During Therapy [facility] for tasks assessed/performed             Past Medical History:  Diagnosis Date   Asthma    CHF (congestive heart failure) ([place])    no CPAP   Chronic cough    COPD (chronic obstructive pulmonary disease) ([place])    Coronary artery disease    Dyspnea    High cholesterol    Hypertension    Melanoma ([place])    Orthopnea    sleeps in a chair   Seasonal allergies    Stroke [facility])    Jan 2007   Past Surgical History:  Procedure Laterality Date   CARDIAC SURGERY     CABG x 5  2007   CATARACT EXTRACTION W/PHACO Left 07/07/2017   Procedure: CATARACT EXTRACTION PHACO AND INTRAOCULAR LENS PLACEMENT ([place]);  Surgeon: Leandrew Koyanagi, MD;  Location: [place];  Service: Ophthalmology;  Laterality: Left;  IVA TOPICAL LEFT   CATARACT EXTRACTION W/PHACO Right 07/28/2017   Procedure: CATARACT EXTRACTION PHACO AND INTRAOCULAR LENS PLACEMENT ([place]) RIGHT;  Surgeon: Leandrew Koyanagi, MD;  Location: [place];  Service: Ophthalmology;  Laterality: Right;   CORONARY ARTERY BYPASS GRAFT     x 5 in 2007   NASAL SINUS SURGERY     UVULECTOMY     Patient Active Problem List   Diagnosis Date Noted   COPD with acute bronchitis ([place]) 09/17/2021   SIRS (systemic inflammatory response syndrome) ([place]) 09/16/2021   Ludwig's angina 10/24/2016   Acute respiratory failure with hypoxia ([place]) 05/06/2015   Acute bronchitis 05/06/2015   Leukocytosis 05/06/2015   Hyperglycemia 05/06/2015   COPD exacerbation ([place]) 05/05/2015    PCP:  Sofie Hartigan, MD  REFERRING PROVIDER: Sofie Hartigan, MD  REFERRING DIAG: Generalized Weakness  THERAPY DIAG:  Muscle weakness (generalized)  Gait difficulty  Chronic left-sided low back pain without sciatica  Rationale for Evaluation and Treatment Rehabilitation  ONSET DATE: 01/30/2021  SUBJECTIVE:   SUBJECTIVE STATEMENT:  11/12/21 Pt. Reports R low back discomfort this morning after pulling weeds out of driveway.   Pt./PT discussed [employer] ex. Program (returning 2-3x/week).     PERTINENT HISTORY: Zachary Socks., MD - 09/24/2021 1:00 PM EDT Formatting of this note is different from the original. Images from the original note were not included. Zachary [name] is a 76 y.o. male that comes today for the following problem(s):  Chief Complaint  Patient presents with  Hospital Follow Up  weakness   HPI: Patient in the office for follow-up of recent hospitalization for generalized weakness and COPD exacerbation. He was admitted on 09/16/2021 and discharged on 09/18/2021. He was placed on IV vancomycin, Flagyl and cefepime initially and then doxycycline and prednisone orally. He has completed the course of antibiotics and steroids as instructed. He notes his cough is significantly improved but still present. He notes the sputum was once again yellowish and this is almost cleared. He was previously treated with Levaquin after  sputum culture demonstrated Pseudomonas sensitive to quinolones earlier this year. He has followed with cardiology since discharge and a referral to pulmonology at his request was placed. He denies shortness of breath or wheezing currently. He notes his weakness is slightly improved but he still wishes to start physical therapy. He does not wish to do home therapy but wants to come to the office to do therapy and needs a referral placed. He notes prior to being taken to the hospital he fell out of his chair to the ground and was unable to  get himself up. While in the hospital he was noted to have leukocytosis and this improved but did not resolve prior to discharge. He continues to use his [place] and [employer] regularly.  Goals   Maintain health/healthy lifestyle   Patient Active Problem List  Diagnosis  Asthma without status asthmaticus  Allergic rhinitis  S/P coronary artery bypass graft x 5  Essential hypertension  Hyperlipidemia  CVA (cerebral vascular accident) (CMS-HCC)  Apnea, sleep  Chest pain with high risk for cardiac etiology  Impaired glucose tolerance  Seasonal allergic rhinitis due to pollen  COPD with exacerbation (CMS-HCC)  Acute bronchitis  COLD (chronic obstructive lung disease) (CMS-HCC)  Hyperglycemia  Elevated white blood cell count  CAD (coronary artery disease)  Hypertension  SOB (shortness of breath) on exertion  Aspiration of fluid causing abnormal reaction or later complication  Dysphagia, pharyngeal phase  Dysphagia, pharyngoesophageal phase   Past Medical History:  Diagnosis Date  Allergic state  Asthma without status asthmaticus  Bronchitis, chronic (CMS-HCC)  CAD (coronary artery disease)  Cataract cortical, senile  s/p surgical correction  Chronic sinusitis  with polyps  COPD (chronic obstructive pulmonary disease) (CMS-HCC) 2015  Elevated cholesterol  Hypertension  Melanoma (CMS-HCC)  malignant  Pneumonia 2023  Sleep apnea  requiring CPAP which the patient does not wear  Stroke (CMS-HCC) 04/12/2005  CVA, hemorrhagic - [facility]    PAIN:  Are you having pain? Yes: NPRS scale: 1/10 Pain location: L low back Pain description: achy/ stiff Aggravating factors: yardwork Relieving factors: rest  PRECAUTIONS: Fall  WEIGHT BEARING RESTRICTIONS No  FALLS:  Has patient fallen in last 6 months? No  LIVING ENVIRONMENT: Lives with: lives with their family Lives in: House/apartment  OCCUPATION: retired  PLOF: Independent  PATIENT GOALS Increase  LE muscle strength/ improve gait.     OBJECTIVE:   PATIENT SURVEYS:  FOTO initial 53/ goal 17  COGNITION:  Overall cognitive status: Within functional limits for tasks assessed     SENSATION: WFL  MUSCLE LENGTH: Hamstrings: Right 50 deg; Left 50 deg  (moderate hamstring/ gastroc tightness)  POSTURE: rounded shoulders, forward head, and flexed trunk   LOWER EXTREMITY ROM:  B shoulder AROM WFL.  B UE strength grossly 5/5 MMT except L/R shoulder flexion 4/5 MMT.   B LE AROM WFL.    LOWER EXTREMITY MMT:  MMT Right eval Left eval  Hip flexion 4/5 4/5  Hip extension    Hip abduction 4+/5  4+/5  Hip adduction 4+/5  4+/5  Hip internal rotation    Hip external rotation    Knee flexion 5/5 5/5  Knee extension 4+/5 4+/5  Ankle dorsiflexion 5/5 5/5  Ankle plantarflexion    Ankle inversion    Ankle eversion     (Blank rows = not tested)  FUNCTIONAL TESTS:  5 times sit to stand: 12.31 sec./ 11.56 sec.  GAIT: Distance walked: in clinic Assistive device utilized: None Level  of assistance: Complete Independence Comments: Decrease L hip flexion/ step pattern/ heel strike noted.  No LOB during eval.     TODAY'S TREATMENT:  11/12/21:  Nustep L6 10 min. B UE/LE.  Discussed [facility]/ line dancing.    5# UE ex. At mirror:  standing shoulder flexion/ bicep curls/ chest press 20x each.  Upright posture correction/ core ex.     Seated/ standing 5# ankle wt. Ex. (LAQ/ marching/ recip. Stairs/ hip abduction/ hip ext./ hamstring curls/ heel raises 20x.  Walking in hallway with increase hip flexion with 5# ankle wts.  Lateral walking/ partial lunges with short holds 4 laps.    Resisted gait at [employer] (140#):  5x forward/ backwards with no UE assist.  125# lateral walking 4x each.  Good heel and toe strike.  SBA for safety/cuing  STS from lowest height of blue mat table 10x.  No UE assist.    Walking outside around building with consistent recip. Gait pattern and  arm swing.      11/07/21:  There.ex.:  Nustep L6 10 min. B UE/LE.  HR 84 bpm/ O2 97%.   Discussed returning to [employer] 2-3 days/week.  Seated/ standing 5# ankle wt. Ex. (LAQ/ marching/ recip. Stairs/ hip abduction/ hip ext./ hamstring curls/ heel raises 20x.    Resisted gait at [employer] (140#):  5x forward/ backwards with no UE assist.  125# lateral walking 5x each.  Good heel and toe strike.  SBA for safety/cuing  Walking in hallway with added alt. UE/LE touches/ lateral braiding with cuing to increase upright posture 2 laps.    BOSU forward step ups 5x each with light UE assist.  Cuing to prevent posterior lean/ correct upright posture.    Walking outside around building with consistent recip. Gait pattern and arm swing.       PATIENT EDUCATION:  Education details: Access Code: [email or web address] Person educated: Patient Education method: Explanation, Demonstration, and Verbal cues Education comprehension: verbalized understanding and returned demonstration   HOME EXERCISE PROGRAM: Access Code: [email or web address] URL: [email or web address] Date: 10/22/2021 Prepared by: Dorcas Carrow   Exercises - Supine Bridge  - 1 x daily - 7 x weekly - 1 sets - 20 reps - Supine March  - 1 x daily - 7 x weekly - 1 sets - 20 reps - Sit to Stand Without Arm Support  - 1 x daily - 7 x weekly - 2 sets - 10 reps - Standing Hip Flexion March  - 1 x daily - 7 x weekly - 1 sets - 20 reps  ASSESSMENT:  CLINICAL IMPRESSION: Pt. Ambulates with recip. Gait pattern and consistent pattern/ arm swing on inside/ outside surfaces with no LOB today.  Pt. Progressing well with resisted/ strengthening ex. Program with no c/o low back pain.  No supine stretches today.  Pt. Will continue with current HEP.  Pt. Will benefit from skilled PT services to increase LE muscle strength to improve safety/ independence with walking.    OBJECTIVE IMPAIRMENTS Abnormal gait, decreased activity tolerance, decreased  balance, decreased coordination, decreased endurance, decreased mobility, difficulty walking, decreased ROM, decreased strength, impaired flexibility, improper body mechanics, and pain.   ACTIVITY LIMITATIONS carrying, lifting, bending, standing, squatting, stairs, and transfers  PARTICIPATION LIMITATIONS: cleaning, community activity, and yard work  PERSONAL FACTORS Age, Fitness, and Past/current experiences are also affecting patient's functional outcome.   REHAB POTENTIAL: Good  CLINICAL DECISION MAKING: Evolving/moderate complexity  EVALUATION COMPLEXITY: Moderate   GOALS: Goals reviewed with patient? Yes  SHORT  TERM GOALS: Target date: 11/25/21 Pt. Will increase B hip flexion muscle strength 1/2 muscle grade to improve pain-free mobility  Baseline: see above Goal status: INITIAL   LONG TERM GOALS: Target date: 12/17/21  Pt. Will increase FOTO to 67 to improve pain-free mobility.   Baseline: initial 53 Goal status: INITIAL  2.  Pt. Will decrease 5xSTS to <10 sec. To improve mobility/ decrease fall risk.  Baseline: see above Goal status: INITIAL  3.  Pt. Able to complete 30 minutes of there.ex. with no increase c/o pain to improve walking/ endurance/ mobility Baseline:  pt. Currently not participating with ex. Program.  Goal status: INITIAL  PLAN: PT FREQUENCY: 2x/week  PT DURATION: 8 weeks  PLANNED INTERVENTIONS: Therapeutic exercises, Therapeutic activity, Neuromuscular re-education, Balance training, Gait training, Patient/Family education, Self Care, Joint mobilization, and Manual therapy  PLAN FOR NEXT SESSION: Progress dynamic balance/ LE strength  Pura Spice, PT, DPT # [phone] 11/12/2021, 9:51 AM

## 2021-11-14 ENCOUNTER — Ambulatory Visit: Payer: Medicare Other | Admitting: Physical Therapy

## 2021-11-14 ENCOUNTER — Encounter: Payer: Self-pay | Admitting: Physical Therapy

## 2021-11-14 DIAGNOSIS — R269 Unspecified abnormalities of gait and mobility: Secondary | ICD-10-CM

## 2021-11-14 DIAGNOSIS — G8929 Other chronic pain: Secondary | ICD-10-CM

## 2021-11-14 DIAGNOSIS — M6281 Muscle weakness (generalized): Secondary | ICD-10-CM

## 2021-11-14 NOTE — Therapy (Signed)
OUTPATIENT PHYSICAL THERAPY LOWER EXTREMITY TREATMENT   Patient Name: Zachary [name] MRN: [id number] DOB:11-13-1945, 76 y.o., male Today's Date: 11/14/2021   PT End of Session - 11/14/21 0900     Visit Number 8    Number of Visits 17    Date for PT Re-Evaluation 12/17/21    PT Start Time 0900    PT Stop Time 0947    PT Time Calculation (min) 47 min    Activity Tolerance Patient tolerated treatment well    Behavior During Therapy [facility] for tasks assessed/performed             Past Medical History:  Diagnosis Date   Asthma    CHF (congestive heart failure) (HCC)    no CPAP   Chronic cough    COPD (chronic obstructive pulmonary disease) (HCC)    Coronary artery disease    Dyspnea    High cholesterol    Hypertension    Melanoma ([place])    Orthopnea    sleeps in a chair   Seasonal allergies    Stroke [facility])    Jan 2007   Past Surgical History:  Procedure Laterality Date   CARDIAC SURGERY     CABG x 5  2007   CATARACT EXTRACTION W/PHACO Left 07/07/2017   Procedure: CATARACT EXTRACTION PHACO AND INTRAOCULAR LENS PLACEMENT ([place]);  Surgeon: Leandrew Koyanagi, MD;  Location: [place];  Service: Ophthalmology;  Laterality: Left;  IVA TOPICAL LEFT   CATARACT EXTRACTION W/PHACO Right 07/28/2017   Procedure: CATARACT EXTRACTION PHACO AND INTRAOCULAR LENS PLACEMENT ([place]) RIGHT;  Surgeon: Leandrew Koyanagi, MD;  Location: [place];  Service: Ophthalmology;  Laterality: Right;   CORONARY ARTERY BYPASS GRAFT     x 5 in 2007   NASAL SINUS SURGERY     UVULECTOMY     Patient Active Problem List   Diagnosis Date Noted   COPD with acute bronchitis ([place]) 09/17/2021   SIRS (systemic inflammatory response syndrome) ([place]) 09/16/2021   Ludwig's angina 10/24/2016   Acute respiratory failure with hypoxia ([place]) 05/06/2015   Acute bronchitis 05/06/2015   Leukocytosis 05/06/2015   Hyperglycemia 05/06/2015   COPD exacerbation ([place]) 05/05/2015    PCP:  Sofie Hartigan, MD  REFERRING PROVIDER: Sofie Hartigan, MD  REFERRING DIAG: Generalized Weakness  THERAPY DIAG:  Muscle weakness (generalized)  Gait difficulty  Chronic 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 Reports continued R low back discomfort.   2/10 back pain with bending/ twisting movements.    PERTINENT HISTORY: [name]., MD - 09/24/2021 1:00 PM EDT Formatting of this note is different from the original. Images from the original note were not included. ASAEL PANN is a 76 y.o. male that comes today for the following problem(s):  Chief Complaint  Patient presents with  Hospital Follow Up  weakness   HPI: Patient in the office for follow-up of recent hospitalization for generalized weakness and COPD exacerbation. He was admitted on 09/16/2021 and discharged on 09/18/2021. He was placed on IV vancomycin, Flagyl and cefepime initially and then doxycycline and prednisone orally. He has completed the course of antibiotics and steroids as instructed. He notes his cough is significantly improved but still present. He notes the sputum was once again yellowish and this is almost cleared. He was previously treated with Levaquin after sputum culture demonstrated Pseudomonas sensitive to quinolones earlier this  year. He has followed with cardiology since discharge and a referral to pulmonology at his request was placed. He denies shortness of breath or wheezing currently. He notes his weakness is slightly improved but he still wishes to start physical therapy. He does not wish to do home therapy but wants to come to the office to do therapy and needs a referral placed. He notes prior to being taken to the hospital he fell out of his chair to the ground and was unable to get himself up. While in the hospital he was noted to have  leukocytosis and this improved but did not resolve prior to discharge. He continues to use his [place] and [employer] regularly.  Goals   Maintain health/healthy lifestyle   Patient Active Problem List  Diagnosis  Asthma without status asthmaticus  Allergic rhinitis  S/P coronary artery bypass graft x 5  Essential hypertension  Hyperlipidemia  CVA (cerebral vascular accident) (CMS-HCC)  Apnea, sleep  Chest pain with high risk for cardiac etiology  Impaired glucose tolerance  Seasonal allergic rhinitis due to pollen  COPD with exacerbation (CMS-HCC)  Acute bronchitis  COLD (chronic obstructive lung disease) (CMS-HCC)  Hyperglycemia  Elevated white blood cell count  CAD (coronary artery disease)  Hypertension  SOB (shortness of breath) on exertion  Aspiration of fluid causing abnormal reaction or later complication  Dysphagia, pharyngeal phase  Dysphagia, pharyngoesophageal phase   Past Medical History:  Diagnosis Date  Allergic state  Asthma without status asthmaticus  Bronchitis, chronic (CMS-HCC)  CAD (coronary artery disease)  Cataract cortical, senile  s/p surgical correction  Chronic sinusitis  with polyps  COPD (chronic obstructive pulmonary disease) (CMS-HCC) 2015  Elevated cholesterol  Hypertension  Melanoma (CMS-HCC)  malignant  Pneumonia 2023  Sleep apnea  requiring CPAP which the patient does not wear  Stroke (CMS-HCC) 04/12/2005  CVA, hemorrhagic - [facility]    PAIN:  Are you having pain? Yes: NPRS scale: 0-2/10 Pain location: L low back Pain description: achy/ stiff Aggravating factors: yardwork Relieving factors: rest  PRECAUTIONS: Fall  WEIGHT BEARING RESTRICTIONS No  FALLS:  Has patient fallen in last 6 months? No  LIVING ENVIRONMENT: Lives with: lives with their family Lives in: House/apartment  OCCUPATION: retired  PLOF: Independent  PATIENT GOALS Increase LE muscle strength/ improve gait.     OBJECTIVE:    PATIENT SURVEYS:  FOTO initial 53/ goal 63  COGNITION:  Overall cognitive status: Within functional limits for tasks assessed     SENSATION: WFL  MUSCLE LENGTH: Hamstrings: Right 50 deg; Left 50 deg  (moderate hamstring/ gastroc tightness)  POSTURE: rounded shoulders, forward head, and flexed trunk   LOWER EXTREMITY ROM:  B shoulder AROM WFL.  B UE strength grossly 5/5 MMT except L/R shoulder flexion 4/5 MMT.   B LE AROM WFL.    LOWER EXTREMITY MMT:  MMT Right eval Left eval  Hip flexion 4/5 4/5  Hip extension    Hip abduction 4+/5  4+/5  Hip adduction 4+/5  4+/5  Hip internal rotation    Hip external rotation    Knee flexion 5/5 5/5  Knee extension 4+/5 4+/5  Ankle dorsiflexion 5/5 5/5  Ankle plantarflexion    Ankle inversion    Ankle eversion     (Blank rows = not tested)  FUNCTIONAL TESTS:  5 times sit to stand: 12.31 sec./ 11.56 sec.  GAIT: Distance walked: in clinic Assistive device utilized: None Level of assistance: Complete Independence Comments: Decrease L hip flexion/  step pattern/ heel strike noted.  No LOB during eval.     TODAY'S TREATMENT:  11/14/21:  Nustep L6 10 min. B UE/LE.  Last 3 minutes B LE only.    Seated blue ball ex.:  pelvic tilts/ marching/ LAQ.  5# UE ex. At mirror:  standing shoulder flexion/ bicep curls 20x each.  Upright posture correction/ core ex.  Good balance on ball.  4# ankle wts. LE ex.: seated marching/ LAQ with holds 20x. Each.    4# ankle wts.: walking in gym (increase hip/knee flexion)/ walking lunges/ lateral walking with braiding 40 feet x 2.   No UE assist/ cuing to correct posture.    Resisted gait at [employer]ilus (140#):  5x forward/ backwards with no UE assist.  Good heel and toe strike.  SBA for safety/cuing  Reviewed SLS in //-bars (challenged with ankle stability on L/R).      PATIENT EDUCATION:  Education details: Access Code: [email or web address] Person educated: Patient Education method: Explanation,  Demonstration, and Verbal cues Education comprehension: verbalized understanding and returned demonstration   HOME EXERCISE PROGRAM: Access Code: [email or web address] URL: [email or web address] Date: 10/22/2021 Prepared by: Dorcas Carrow   Exercises - Supine Bridge  - 1 x daily - 7 x weekly - 1 sets - 20 reps - Supine March  - 1 x daily - 7 x weekly - 1 sets - 20 reps - Sit to Stand Without Arm Support  - 1 x daily - 7 x weekly - 2 sets - 10 reps - Standing Hip Flexion March  - 1 x daily - 7 x weekly - 1 sets - 20 reps  ASSESSMENT:  CLINICAL IMPRESSION: PT discussed transitioning ex. Program to gym based at [employer] or [employer] for [employer].  Pt. Ambulates with recip. Gait pattern and arm swing on inside/ outside surfaces with no LOB today.  Moderate forward head/ rounded shoulder posture in seated/ standing position.  Pt. Progressing well with resisted/ strengthening ex. Program with no c/o low back pain.  Pt. Will continue with current HEP.  Pt. Will benefit from skilled PT services to increase LE muscle strength to improve safety/ independence with walking.    OBJECTIVE IMPAIRMENTS Abnormal gait, decreased activity tolerance, decreased balance, decreased coordination, decreased endurance, decreased mobility, difficulty walking, decreased ROM, decreased strength, impaired flexibility, improper body mechanics, and pain.   ACTIVITY LIMITATIONS carrying, lifting, bending, standing, squatting, stairs, and transfers  PARTICIPATION LIMITATIONS: cleaning, community activity, and yard work  PERSONAL FACTORS Age, Fitness, and Past/current experiences are also affecting patient's functional outcome.   REHAB POTENTIAL: Good  CLINICAL DECISION MAKING: Evolving/moderate complexity  EVALUATION COMPLEXITY: Moderate   GOALS: Goals reviewed with patient? Yes  SHORT TERM GOALS: Target date: 11/25/21 Pt. Will increase B hip flexion muscle strength 1/2 muscle grade to improve  pain-free mobility  Baseline: see above Goal status: INITIAL   LONG TERM GOALS: Target date: 12/17/21  Pt. Will increase FOTO to 67 to improve pain-free mobility.   Baseline: initial 53 Goal status: INITIAL  2.  Pt. Will decrease 5xSTS to <10 sec. To improve mobility/ decrease fall risk.  Baseline: see above Goal status: INITIAL  3.  Pt. Able to complete 30 minutes of there.ex. with no increase c/o pain to improve walking/ endurance/ mobility Baseline:  pt. Currently not participating with ex. Program.  Goal status: INITIAL  PLAN: PT FREQUENCY: 2x/week  PT DURATION: 8 weeks  PLANNED INTERVENTIONS: Therapeutic exercises, Therapeutic activity, Neuromuscular re-education, Balance training, Gait training, Patient/Family education, Self Care, Joint  mobilization, and Manual therapy  PLAN FOR NEXT SESSION: Progress dynamic balance/ LE strength.  CHECK insurance next tx.   Pura Spice, PT, DPT # [phone] 11/14/2021, 10:54 AM

## 2021-11-19 ENCOUNTER — Ambulatory Visit: Payer: Medicare Other | Admitting: Physical Therapy

## 2021-11-19 ENCOUNTER — Encounter: Payer: Self-pay | Admitting: Physical Therapy

## 2021-11-19 DIAGNOSIS — R269 Unspecified abnormalities of gait and mobility: Secondary | ICD-10-CM

## 2021-11-19 DIAGNOSIS — M6281 Muscle weakness (generalized): Secondary | ICD-10-CM

## 2021-11-19 DIAGNOSIS — M545 Low back pain, unspecified: Secondary | ICD-10-CM

## 2021-11-19 NOTE — Therapy (Signed)
OUTPATIENT PHYSICAL THERAPY LOWER EXTREMITY TREATMENT   Patient Name: Zachary [name] MRN: [id number] DOB:[birth year], 76 y.o., male Today's Date: 11/19/2021   PT End of Session - 11/19/21 0857     Visit Number 9    Number of Visits 17    Date for PT Re-Evaluation 12/17/21    PT Start Time 4259    PT Stop Time 0945    PT Time Calculation (min) 48 min    Activity Tolerance Patient tolerated treatment well    Behavior During Therapy [facility] for tasks assessed/performed             Past Medical History:  Diagnosis Date   Asthma    CHF (congestive heart failure) ([place])    no CPAP   Chronic cough    COPD (chronic obstructive pulmonary disease) ([place])    Coronary artery disease    Dyspnea    High cholesterol    Hypertension    Melanoma ([address])    Orthopnea    sleeps in a chair   Seasonal allergies    Stroke [facility])    Jan 2007   Past Surgical History:  Procedure Laterality Date   CARDIAC SURGERY     CABG x 5  2007   CATARACT EXTRACTION W/PHACO Left 07/07/2017   Procedure: CATARACT EXTRACTION PHACO AND INTRAOCULAR LENS PLACEMENT ([place]);  Surgeon: Leandrew Koyanagi, MD;  Location: [facility];  Service: Ophthalmology;  Laterality: Left;  IVA TOPICAL LEFT   CATARACT EXTRACTION W/PHACO Right 07/28/2017   Procedure: CATARACT EXTRACTION PHACO AND INTRAOCULAR LENS PLACEMENT ([place]) RIGHT;  Surgeon: Leandrew Koyanagi, MD;  Location: [facility];  Service: Ophthalmology;  Laterality: Right;   CORONARY ARTERY BYPASS GRAFT     x 5 in 2007   NASAL SINUS SURGERY     UVULECTOMY     Patient Active Problem List   Diagnosis Date Noted   COPD with acute bronchitis ([place]) 09/17/2021   SIRS (systemic inflammatory response syndrome) ([place]) 09/16/2021   Ludwig's angina 10/24/2016   Acute respiratory failure with hypoxia ([place]) 05/06/2015   Acute bronchitis 05/06/2015   Leukocytosis 05/06/2015   Hyperglycemia 05/06/2015   COPD exacerbation ([place]) 05/05/2015    PCP:  Sofie Hartigan, MD  REFERRING PROVIDER: Sofie Hartigan, MD  REFERRING DIAG: Generalized Weakness  THERAPY DIAG:  Muscle weakness (generalized)  Gait difficulty  Chronic left-sided low back pain without sciatica  Rationale for Evaluation and Treatment Rehabilitation  ONSET DATE: 01/30/2021  SUBJECTIVE:   SUBJECTIVE STATEMENT:  11/19/21 Pt. States he joined [employer] for the year.  Pt. Continues to report R low back discomfort with increase activity, esp. After prolonged sitting.  No c/o pain while using Nustep.     PERTINENT HISTORY: Zachary [name]., MD - 09/24/2021 1:00 PM EDT Formatting of this note is different from the original. Images from the original note were not included. LYNNE RIGHI is a 76 y.o. male that comes today for the following problem(s):  Chief Complaint  Patient presents with  Hospital Follow Up  weakness   HPI: Patient in the office for follow-up of recent hospitalization for generalized weakness and COPD exacerbation. He was admitted on 09/16/2021 and discharged on 09/18/2021. He was placed on IV vancomycin, Flagyl and cefepime initially and then doxycycline and prednisone orally. He has completed the course of antibiotics and steroids as instructed. He notes his cough is significantly improved but still present. He notes the sputum was once again yellowish and this is almost  cleared. He was previously treated with Levaquin after sputum culture demonstrated Pseudomonas sensitive to quinolones earlier this year. He has followed with cardiology since discharge and a referral to pulmonology at his request was placed. He denies shortness of breath or wheezing currently. He notes his weakness is slightly improved but he still wishes to start physical therapy. He does not wish to do home therapy but wants to come to the office to do therapy and needs a referral placed. He notes prior to being taken to the hospital he fell out of his chair  to the ground and was unable to get himself up. While in the hospital he was noted to have leukocytosis and this improved but did not resolve prior to discharge. He continues to use his [place] and [employer] Ellipta regularly.  Goals   Maintain health/healthy lifestyle   Patient Active Problem List  Diagnosis  Asthma without status asthmaticus  Allergic rhinitis  S/P coronary artery bypass graft x 5  Essential hypertension  Hyperlipidemia  CVA (cerebral vascular accident) (CMS-HCC)  Apnea, sleep  Chest pain with high risk for cardiac etiology  Impaired glucose tolerance  Seasonal allergic rhinitis due to pollen  COPD with exacerbation (CMS-HCC)  Acute bronchitis  COLD (chronic obstructive lung disease) (CMS-HCC)  Hyperglycemia  Elevated white blood cell count  CAD (coronary artery disease)  Hypertension  SOB (shortness of breath) on exertion  Aspiration of fluid causing abnormal reaction or later complication  Dysphagia, pharyngeal phase  Dysphagia, pharyngoesophageal phase   Past Medical History:  Diagnosis Date  Allergic state  Asthma without status asthmaticus  Bronchitis, chronic (CMS-HCC)  CAD (coronary artery disease)  Cataract cortical, senile  s/p surgical correction  Chronic sinusitis  with polyps  COPD (chronic obstructive pulmonary disease) (CMS-HCC) 2015  Elevated cholesterol  Hypertension  Melanoma (CMS-HCC)  malignant  Pneumonia 2023  Sleep apnea  requiring CPAP which the patient does not wear  Stroke (CMS-HCC) 04/12/2005  CVA, hemorrhagic - [facility]    PAIN:  Are you having pain? Yes: NPRS scale: 0-2/10 Pain location: L low back Pain description: achy/ stiff Aggravating factors: yardwork Relieving factors: rest  PRECAUTIONS: Fall  WEIGHT BEARING RESTRICTIONS No  FALLS:  Has patient fallen in last 6 months? No  LIVING ENVIRONMENT: Lives with: lives with their family Lives in: House/apartment  OCCUPATION: retired  PLOF:  Independent  PATIENT GOALS Increase LE muscle strength/ improve gait.     OBJECTIVE:   PATIENT SURVEYS:  FOTO initial 53/ goal 72  COGNITION:  Overall cognitive status: Within functional limits for tasks assessed     SENSATION: WFL  MUSCLE LENGTH: Hamstrings: Right 50 deg; Left 50 deg  (moderate hamstring/ gastroc tightness)  POSTURE: rounded shoulders, forward head, and flexed trunk   LOWER EXTREMITY ROM:  B shoulder AROM WFL.  B UE strength grossly 5/5 MMT except L/R shoulder flexion 4/5 MMT.   B LE AROM WFL.    LOWER EXTREMITY MMT:  MMT Right eval Left eval  Hip flexion 4/5 4/5  Hip extension    Hip abduction 4+/5  4+/5  Hip adduction 4+/5  4+/5  Hip internal rotation    Hip external rotation    Knee flexion 5/5 5/5  Knee extension 4+/5 4+/5  Ankle dorsiflexion 5/5 5/5  Ankle plantarflexion    Ankle inversion    Ankle eversion     (Blank rows = not tested)  FUNCTIONAL TESTS:  5 times sit to stand: 12.31 sec./ 11.56 sec.  GAIT: Distance  walked: in clinic Assistive device utilized: None Level of assistance: Complete Independence Comments: Decrease L hip flexion/ step pattern/ heel strike noted.  No LOB during eval.     TODAY'S TREATMENT:  11/19/21:  Nustep L6 10 min. B UE/LE.  Last 5 minutes B LE only.    Standing lumbar AROM reassessment.  R lumbar paraspinal soft tissue tightness noted.  Seated blue ball ex.:  pelvic tilts/ marching/ LAQ.  4# UE ex. At mirror:  seated on ball shoulder flexion/ chest press/ bicep curls 20x each.  Upright posture correction/ core ex.  Good balance on ball.  Resisted gait at Nautilus (140#):  5x forward/ backwards with no UE assist.  Good heel and toe strike.  SBA for safety/cuing.   Core ex.: Nautilus walk outs 50#. 2x5 each.    Discussed [employer] ex. Program.     PATIENT EDUCATION:  Education details: Access Code: [email or web address] Person educated: Patient Education method: Explanation, Demonstration, and  Verbal cues Education comprehension: verbalized understanding and returned demonstration   HOME EXERCISE PROGRAM: Access Code: [email or web address] URL: [email or web address] Date: 10/22/2021 Prepared by: Dorcas Carrow   Exercises - Supine Bridge  - 1 x daily - 7 x weekly - 1 sets - 20 reps - Supine March  - 1 x daily - 7 x weekly - 1 sets - 20 reps - Sit to Stand Without Arm Support  - 1 x daily - 7 x weekly - 2 sets - 10 reps - Standing Hip Flexion March  - 1 x daily - 7 x weekly - 1 sets - 20 reps  ASSESSMENT:  CLINICAL IMPRESSION: Pt. Reports no increase c/o low back pain during tx. Session.  Pt. Is going to plan to return to [employer] this week and start gym based ex.  PT will reassess pts. Program/ goals next week.  Probable discharge from PT with gym ex. Focus after next tx. If not issues.   Moderate forward head/ rounded shoulder posture in seated/ standing position.  Pt. Progressing well with resisted/ strengthening ex. Program with no c/o low back pain.  Pt. Will continue with current HEP.  Pt. Will benefit from skilled PT services to increase LE muscle strength to improve safety/ independence with walking.    OBJECTIVE IMPAIRMENTS Abnormal gait, decreased activity tolerance, decreased balance, decreased coordination, decreased endurance, decreased mobility, difficulty walking, decreased ROM, decreased strength, impaired flexibility, improper body mechanics, and pain.   ACTIVITY LIMITATIONS carrying, lifting, bending, standing, squatting, stairs, and transfers  PARTICIPATION LIMITATIONS: cleaning, community activity, and yard work  PERSONAL FACTORS Age, Fitness, and Past/current experiences are also affecting patient's functional outcome.   REHAB POTENTIAL: Good  CLINICAL DECISION MAKING: Evolving/moderate complexity  EVALUATION COMPLEXITY: Moderate   GOALS: Goals reviewed with patient? Yes  SHORT TERM GOALS: Target date: 11/25/21 Pt. Will increase B hip  flexion muscle strength 1/2 muscle grade to improve pain-free mobility  Baseline: see above Goal status: INITIAL   LONG TERM GOALS: Target date: 12/17/21  Pt. Will increase FOTO to 67 to improve pain-free mobility.   Baseline: initial 53 Goal status: INITIAL  2.  Pt. Will decrease 5xSTS to <10 sec. To improve mobility/ decrease fall risk.  Baseline: see above Goal status: INITIAL  3.  Pt. Able to complete 30 minutes of there.ex. with no increase c/o pain to improve walking/ endurance/ mobility Baseline:  pt. Currently not participating with ex. Program.  Goal status: INITIAL  PLAN: PT FREQUENCY: 2x/week  PT DURATION: 8 weeks  PLANNED INTERVENTIONS: Therapeutic  exercises, Therapeutic activity, Neuromuscular re-education, Balance training, Gait training, Patient/Family education, Self Care, Joint mobilization, and Manual therapy  PLAN FOR NEXT SESSION: Progress dynamic balance/ LE strength.    Pura Spice, PT, DPT # [phone] 11/19/2021, 10:46 AM

## 2021-11-21 ENCOUNTER — Encounter: Payer: Medicare Other | Admitting: Physical Therapy

## 2021-11-22 ENCOUNTER — Ambulatory Visit: Payer: Medicare Other | Admitting: Physical Therapy

## 2021-11-26 ENCOUNTER — Ambulatory Visit: Payer: Medicare Other | Admitting: Physical Therapy

## 2021-11-26 ENCOUNTER — Encounter: Payer: Self-pay | Admitting: Physical Therapy

## 2021-11-26 DIAGNOSIS — M6281 Muscle weakness (generalized): Secondary | ICD-10-CM | POA: Diagnosis not present

## 2021-11-26 DIAGNOSIS — G8929 Other chronic pain: Secondary | ICD-10-CM

## 2021-11-26 DIAGNOSIS — R269 Unspecified abnormalities of gait and mobility: Secondary | ICD-10-CM

## 2021-11-26 DIAGNOSIS — M545 Low back pain, unspecified: Secondary | ICD-10-CM

## 2021-11-26 NOTE — Therapy (Signed)
OUTPATIENT PHYSICAL THERAPY LOWER EXTREMITY TREATMENT Physical Therapy Progress Note   Dates of reporting period  10/22/21  to  11/26/21   Patient Name: Zachary [name] MRN: [id number] DOB:1946-03-08, 76 y.o., male Today's Date: 11/26/2021   PT End of Session - 11/26/21 0901     Visit Number 10    Number of Visits 17    Date for PT Re-Evaluation 12/17/21    PT Start Time 7371    PT Stop Time 0944    PT Time Calculation (min) 49 min    Activity Tolerance Patient tolerated treatment well    Behavior During Therapy [facility] for tasks assessed/performed             Past Medical History:  Diagnosis Date   Asthma    CHF (congestive heart failure) (HCC)    no CPAP   Chronic cough    COPD (chronic obstructive pulmonary disease) (HCC)    Coronary artery disease    Dyspnea    High cholesterol    Hypertension    Melanoma ([facility]achary [name])    Orthopnea    sleeps in a chair   Seasonal allergies    Stroke [facility])    Jan 2007   Past Surgical History:  Procedure Laterality Date   CARDIAC SURGERY     CABG x 5  2007   CATARACT EXTRACTION W/PHACO Left 07/07/2017   Procedure: CATARACT EXTRACTION PHACO AND INTRAOCULAR LENS PLACEMENT ([place]);  Surgeon: Leandrew Koyanagi, [name];  Location: [facility];  Service: Ophthalmology;  Laterality: Left;  IVA TOPICAL LEFT   CATARACT EXTRACTION W/PHACO Right 07/28/2017   Procedure: CATARACT EXTRACTION PHACO AND INTRAOCULAR LENS PLACEMENT ([place]) RIGHT;  Surgeon: Leandrew Koyanagi, [name];  Location: [facility];  Service: Ophthalmology;  Laterality: Right;   CORONARY ARTERY BYPASS GRAFT     x 5 in 2007   NASAL SINUS SURGERY     UVULECTOMY     Patient Active Problem List   Diagnosis Date Noted   COPD with acute bronchitis ([place]) 09/17/2021   SIRS (systemic inflammatory response syndrome) ([place]) 09/16/2021   Ludwig's angina 10/24/2016   Acute respiratory failure with hypoxia ([place]) 05/06/2015   Acute bronchitis 05/06/2015   Leukocytosis 05/06/2015    Hyperglycemia 05/06/2015   COPD exacerbation ([place]) 05/05/2015    PCP: Zachary [name]  REFERRING PROVIDER: Sofie Hartigan, [name]  REFERRING DIAG: Generalized Weakness  THERAPY DIAG:  Muscle weakness (generalized)  Gait difficulty  Chronic left-sided low back pain without sciatica  Rationale for Evaluation and Treatment Rehabilitation  ONSET DATE: 01/30/2021  SUBJECTIVE:   SUBJECTIVE STATEMENT:  11/26/21 Pt. Went to [employer] last Friday morning.  Pt. Continues to report R low back discomfort with increase activity, esp. After prolonged sitting.  No c/o pain while using Nustep.     PERTINENT HISTORY: Zachary Socks., [name] - 09/24/2021 1:00 PM EDT Formatting of this note is different from the original. Images from the original note were not included. Zachary [name] is a 76 y.o. male that comes today for the following problem(s):  Chief Complaint  Patient presents with  Hospital Follow Up  weakness   HPI: Patient in the office for follow-up of recent hospitalization for generalized weakness and COPD exacerbation. He was admitted on 09/16/2021 and discharged on 09/18/2021. He was placed on IV vancomycin, Flagyl and cefepime initially and then doxycycline and prednisone orally. He has completed the course of antibiotics and steroids as instructed. He notes his cough is significantly improved  but still present. He notes the sputum was once again yellowish and this is almost cleared. He was previously treated with Levaquin after sputum culture demonstrated Pseudomonas sensitive to quinolones earlier this year. He has followed with cardiology since discharge and a referral to pulmonology at his request was placed. He denies shortness of breath or wheezing currently. He notes his weakness is slightly improved but he still wishes to start physical therapy. He does not wish to do home therapy but wants to come to the office to do therapy and needs a referral  placed. He notes prior to being taken to the hospital he fell out of his chair to the ground and was unable to get himself up. While in the hospital he was noted to have leukocytosis and this improved but did not resolve prior to discharge. He continues to use his [place] and [employer] regularly.  Goals   Maintain health/healthy lifestyle   Patient Active Problem List  Diagnosis  Asthma without status asthmaticus  Allergic rhinitis  S/P coronary artery bypass graft x 5  Essential hypertension  Hyperlipidemia  CVA (cerebral vascular accident) (CMS-HCC)  Apnea, sleep  Chest pain with high risk for cardiac etiology  Impaired glucose tolerance  Seasonal allergic rhinitis due to pollen  COPD with exacerbation (CMS-HCC)  Acute bronchitis  COLD (chronic obstructive lung disease) (CMS-HCC)  Hyperglycemia  Elevated white blood cell count  CAD (coronary artery disease)  Hypertension  SOB (shortness of breath) on exertion  Aspiration of fluid causing abnormal reaction or later complication  Dysphagia, pharyngeal phase  Dysphagia, pharyngoesophageal phase   Past Medical History:  Diagnosis Date  Allergic state  Asthma without status asthmaticus  Bronchitis, chronic (CMS-HCC)  CAD (coronary artery disease)  Cataract cortical, senile  s/p surgical correction  Chronic sinusitis  with polyps  COPD (chronic obstructive pulmonary disease) (CMS-HCC) 2015  Elevated cholesterol  Hypertension  Melanoma (CMS-HCC)  malignant  Pneumonia 2023  Sleep apnea  requiring CPAP which the patient does not wear  Stroke (CMS-HCC) 04/12/2005  CVA[facility] hemorrhagic - [facility]    PAIN:  Are you having pain? Yes: NPRS scale: 0-2/10 Pain location: L low back Pain description: achy/ stiff Aggravating factors: yardwork Relieving factors: rest  PRECAUTIONS: Fall  WEIGHT BEARING RESTRICTIONS No  FALLS:  Has patient fallen in last 6 months? No  LIVING ENVIRONMENT: Lives with: lives  with their family Lives in: House/apartment  OCCUPATION: retired  PLOF: Independent  PATIENT GOALS Increase LE muscle strength/ improve gait.     OBJECTIVE:   PATIENT SURVEYS:  FOTO initial 53/ goal 55  COGNITION:  Overall cognitive status: Within functional limits for tasks assessed     SENSATION: WFL  MUSCLE LENGTH: Hamstrings: Right 50 deg; Left 50 deg  (moderate hamstring/ gastroc tightness)  POSTURE: rounded shoulders, forward head, and flexed trunk   LOWER EXTREMITY ROM:  B shoulder AROM WFL.  B UE strength grossly 5/5 MMT except L/R shoulder flexion 4/5 MMT.   B LE AROM WFL.    LOWER EXTREMITY MMT:  MMT Right eval Left eval  Hip flexion 4/5 4/5  Hip extension    Hip abduction 4+/5  4+/5  Hip adduction 4+/5  4+/5  Hip internal rotation    Hip external rotation    Knee flexion 5/5 5/5  Knee extension 4+/5 4+/5  Ankle dorsiflexion 5/5 5/5  Ankle plantarflexion    Ankle inversion    Ankle eversion     (Blank rows = not tested)  FUNCTIONAL TESTS:  5 times sit to stand: 12.31 sec./ 11.56 sec.  GAIT: Distance walked: in clinic Assistive device utilized: None Level of assistance: Complete Independence Comments: Decrease L hip flexion/ step pattern/ heel strike noted.  No LOB during eval.     TODAY'S TREATMENT:  11/26/21:  Nustep L6 10 min. B UE/LE.  Discussed 1st time at [facility] [employer]/ pt. Given name of personal trainer to contact to set up gym based ex. Program.    Resisted gait at Nautilus (140#):  5x forward/ backwards with no UE assist.  Good heel and toe strike.  SBA for safety/cuing.     Nautilus: seated on blue ball 50# lat. Pull downs/ 30# tricep extension 20x each.  Standing scap. Retraction with 50#/ chest press 10x2 with feel staggered.    Hallway walking with high marching/ tandem gait in //-bars (forward/backwards)/ braiding L/R 2 laps each.    BOSU step ups forward/ lateral with light UE assist.  Pt. Requires occasional use of  //-bars to correct LOB while standing on BOSU.    Discussed [employer] ex. Program.     PATIENT EDUCATION:  Education details: Access Code: [email or web address] Person educated: Patient Education method: Explanation, Demonstration, and Verbal cues Education comprehension: verbalized understanding and returned demonstration   HOME EXERCISE PROGRAM: Access Code: [email or web address] URL: [email or web address] Date: 10/22/2021 Prepared by: Dorcas Carrow   Exercises - Supine Bridge  - 1 x daily - 7 x weekly - 1 sets - 20 reps - Supine March  - 1 x daily - 7 x weekly - 1 sets - 20 reps - Sit to Stand Without Arm Support  - 1 x daily - 7 x weekly - 2 sets - 10 reps - Standing Hip Flexion March  - 1 x daily - 7 x weekly - 1 sets - 20 reps  ASSESSMENT:  CLINICAL IMPRESSION: Pt. Reports no increase c/o low back pain during tx. Session.  Pt. Will continue to go to gym 2-3x/week and decrease PT tx. Frequency to 1x/week.  Moderate forward head/ rounded shoulder posture in seated/ standing position.  Pt. Progressing well with resisted/ strengthening ex. Program with no c/o low back pain.  Pt. Will continue with current HEP.  Pt. Will benefit from skilled PT services to increase LE muscle strength to improve safety/ independence with walking.    OBJECTIVE IMPAIRMENTS Abnormal gait, decreased activity tolerance, decreased balance, decreased coordination, decreased endurance, decreased mobility, difficulty walking, decreased ROM, decreased strength, impaired flexibility, improper body mechanics, and pain.   ACTIVITY LIMITATIONS carrying, lifting, bending, standing, squatting, stairs, and transfers  PARTICIPATION LIMITATIONS: cleaning, community activity, and yard work  PERSONAL FACTORS Age, Fitness, and Past/current experiences are also affecting patient's functional outcome.   REHAB POTENTIAL: Good  CLINICAL DECISION MAKING: Evolving/moderate complexity  EVALUATION COMPLEXITY:  Moderate   GOALS: Goals reviewed with patient? Yes  SHORT TERM GOALS: Target date: 11/25/21 Pt. Will increase B hip flexion muscle strength 1/2 muscle grade to improve pain-free mobility  Baseline: see above Goal status: Partially met   LONG TERM GOALS: Target date: 12/17/21  Pt. Will increase FOTO to 67 to improve pain-free mobility.   Baseline: initial 53 Goal status: On-going  2.  Pt. Will decrease 5xSTS to <10 sec. To improve mobility/ decrease fall risk.  Baseline: see above Goal status: On-going  3.  Pt. Able to complete 30 minutes of there.ex. with no increase c/o pain to improve walking/ endurance/ mobility Baseline:  pt. Currently not participating with ex. Program.  8/28:  pt. Has returned to [employer] 1x Goal status: Partially met  PLAN: PT FREQUENCY: 2x/week  PT DURATION: 8 weeks  PLANNED INTERVENTIONS: Therapeutic exercises, Therapeutic activity, Neuromuscular re-education, Balance training, Gait training, Patient/Family education, Self Care, Joint mobilization, and Manual therapy  PLAN FOR NEXT SESSION: Progress dynamic balance/ LE strength.    Pura Spice, PT, DPT # [phone] 11/26/2021, 12:38 PM

## 2021-11-28 ENCOUNTER — Encounter: Payer: Medicare Other | Admitting: Physical Therapy

## 2021-12-07 ENCOUNTER — Ambulatory Visit: Payer: Medicare Other | Attending: Family Medicine | Admitting: Physical Therapy

## 2021-12-07 ENCOUNTER — Encounter: Payer: Self-pay | Admitting: Physical Therapy

## 2021-12-07 DIAGNOSIS — R269 Unspecified abnormalities of gait and mobility: Secondary | ICD-10-CM | POA: Insufficient documentation

## 2021-12-07 DIAGNOSIS — M545 Low back pain, unspecified: Secondary | ICD-10-CM | POA: Diagnosis present

## 2021-12-07 DIAGNOSIS — G8929 Other chronic pain: Secondary | ICD-10-CM | POA: Insufficient documentation

## 2021-12-07 DIAGNOSIS — M6281 Muscle weakness (generalized): Secondary | ICD-10-CM | POA: Diagnosis present

## 2021-12-07 NOTE — Therapy (Signed)
OUTPATIENT PHYSICAL THERAPY LOWER EXTREMITY TREATMENT   Patient Name: Zachary [name] MRN: [id number] DOB:03-Oct-1945, 76 y.o., male Today's Date: 12/07/2021   PT End of Session - 12/07/21 0851     Visit Number 11    Number of Visits 17    Date for PT Re-Evaluation 12/17/21    PT Start Time 0856    PT Stop Time 0944    PT Time Calculation (min) 48 min    Activity Tolerance Patient tolerated treatment well    Behavior During Therapy [name] for tasks assessed/performed             Past Medical History:  Diagnosis Date   Asthma    CHF (congestive heart failure) (HCC)    no CPAP   Chronic cough    COPD (chronic obstructive pulmonary disease) ([place])    Coronary artery disease    Dyspnea    High cholesterol    Hypertension    Melanoma ([place])    Orthopnea    sleeps in a chair   Seasonal allergies    Stroke Zachary [name])    Jan 2007   Past Surgical History:  Procedure Laterality Date   CARDIAC SURGERY     CABG x 5  2007   CATARACT EXTRACTION W/PHACO Left 07/07/2017   Procedure: CATARACT EXTRACTION PHACO AND INTRAOCULAR LENS PLACEMENT (Zachary [name]);  Surgeon: Zachary Koyanagi, MD;  Location: [place];  Service: Ophthalmology;  Laterality: Left;  IVA TOPICAL LEFT   CATARACT EXTRACTION W/PHACO Right 07/28/2017   Procedure: CATARACT EXTRACTION PHACO AND INTRAOCULAR LENS PLACEMENT ([place]) RIGHT;  Surgeon: Zachary Koyanagi, MD;  Location: [place];  Service: Ophthalmology;  Laterality: Right;   CORONARY ARTERY BYPASS GRAFT     x 5 in 2007   NASAL SINUS SURGERY     UVULECTOMY     Patient Active Problem List   Diagnosis Date Noted   COPD with acute bronchitis ([place]) 09/17/2021   SIRS (systemic inflammatory response syndrome) ([place]) 09/16/2021   Ludwig's angina 10/24/2016   Acute respiratory failure with hypoxia ([place]) 05/06/2015   Acute bronchitis 05/06/2015   Leukocytosis 05/06/2015   Hyperglycemia 05/06/2015   COPD exacerbation ([place]) 05/05/2015    PCP:  Zachary Hartigan, MD  REFERRING PROVIDER: Sofie Hartigan, MD  REFERRING DIAG: Generalized Weakness  THERAPY DIAG:  Muscle weakness (generalized)  Gait difficulty  Chronic left-sided low back pain without sciatica  Rationale for Evaluation and Treatment Rehabilitation  ONSET DATE: 01/30/2021  SUBJECTIVE:   SUBJECTIVE STATEMENT:  12/07/21 Pt. Has been going to [employer] 2x/week.  Pt. Brought gym based ex. Program for PT to review.  No c/o low back pain prior to tx. Session.  PT discussed pts. Sleeping/ recliner use at night.  Pt. States wife wants him to return to the bed to sleep.    PERTINENT HISTORY: Zachary Socks., MD - 09/24/2021 1:00 PM EDT Formatting of this note is different from the original. Images from the original note were not included. Zachary [name] is a 76 y.o. male that comes today for the following problem(s):  Chief Complaint  Patient presents with  Hospital Follow Up  weakness   HPI: Patient in the office for follow-up of recent hospitalization for generalized weakness and COPD exacerbation. He was admitted on 09/16/2021 and discharged on 09/18/2021. He was placed on IV vancomycin, Flagyl and cefepime initially and then doxycycline and prednisone orally. He has completed the course of antibiotics and steroids as instructed. He notes his cough  is significantly improved but still present. He notes the sputum was once again yellowish and this is almost cleared. He was previously treated with Levaquin after sputum culture demonstrated Pseudomonas sensitive to quinolones earlier this year. He has followed with cardiology since discharge and a referral to pulmonology at his request was placed. He denies shortness of breath or wheezing currently. He notes his weakness is slightly improved but he still wishes to start physical therapy. He does not wish to do home therapy but wants to come to the office to do therapy and needs a referral placed. He  notes prior to being taken to the hospital he fell out of his chair to the ground and was unable to get himself up. While in the hospital he was noted to have leukocytosis and this improved but did not resolve prior to discharge. He continues to use his Zachary [name] [employer]a regularly.  Goals   Maintain health/healthy lifestyle   Patient Active Problem List  Diagnosis  Asthma without status asthmaticus  Allergic rhinitis  S/P coronary artery bypass graft x 5  Essential hypertension  Hyperlipidemia  CVA (cerebral vascular accident) (CMS-HCC)  Apnea, sleep  Chest pain with high risk for cardiac etiology  Impaired glucose tolerance  Seasonal allergic rhinitis due to pollen  COPD with exacerbation (CMS-HCC)  Acute bronchitis  COLD (chronic obstructive lung disease) (CMS-HCC)  Hyperglycemia  Elevated white blood cell count  CAD (coronary artery disease)  Hypertension  SOB (shortness of breath) on exertion  Aspiration of fluid causing abnormal reaction or later complication  Dysphagia, pharyngeal phase  Dysphagia, pharyngoesophageal phase   Past Medical History:  Diagnosis Date  Allergic state  Asthma without status asthmaticus  Bronchitis, chronic (CMS-HCC)  CAD (coronary artery disease)  Cataract cortical, senile  s/p surgical correction  Chronic sinusitis  with polyps  COPD (chronic obstructive pulmonary disease) (CMS-HCC) 2015  Elevated cholesterol  Hypertension  Melanoma (CMS-HCC)  malignant  Pneumonia 2023  Sleep apnea  requiring CPAP which the patient does not wear  Stroke (CMS-HCC) 04/12/2005  CVA, hemorrhagic - [facility]    PAIN:  Are you having pain? Yes: NPRS scale: 0-2/10 Pain location: L low back Pain description: achy/ stiff Aggravating factors: yardwork Relieving factors: rest  PRECAUTIONS: Fall  WEIGHT BEARING RESTRICTIONS No  FALLS:  Has patient fallen in last 6 months? No  LIVING ENVIRONMENT: Lives with: lives with their  family Lives in: House/apartment  OCCUPATION: retired  PLOF: Independent  PATIENT GOALS Increase LE muscle strength/ improve gait.     OBJECTIVE:   PATIENT SURVEYS:  FOTO initial 53/ goal 68  COGNITION:  Overall cognitive status: Within functional limits for tasks assessed     SENSATION: WFL  MUSCLE LENGTH: Hamstrings: Right 50 deg; Left 50 deg  (moderate hamstring/ gastroc tightness)  POSTURE: rounded shoulders, forward head, and flexed trunk   LOWER EXTREMITY ROM:  B shoulder AROM WFL.  B UE strength grossly 5/5 MMT except L/R shoulder flexion 4/5 MMT.   B LE AROM WFL.    LOWER EXTREMITY MMT:  MMT Right eval Left eval  Hip flexion 4/5 4/5  Hip extension    Hip abduction 4+/5  4+/5  Hip adduction 4+/5  4+/5  Hip internal rotation    Hip external rotation    Knee flexion 5/5 5/5  Knee extension 4+/5 4+/5  Ankle dorsiflexion 5/5 5/5  Ankle plantarflexion    Ankle inversion    Ankle eversion     (Blank rows =  not tested)  FUNCTIONAL TESTS:  5 times sit to stand: 12.31 sec./ 11.56 sec.  GAIT: Distance walked: in clinic Assistive device utilized: None Level of assistance: Complete Independence Comments: Decrease L hip flexion/ step pattern/ heel strike noted.  No LOB during eval.     TODAY'S TREATMENT:  12/07/21:  Nustep L6 10 min. B UE/LE.  Discussed gym based ex./ sleeping in recliner.    Walking around PT clinic 3 laps.  Standing hamstring/ gastroc stretches 4x at stairs each with holds.     Nautilus: seated on blue ball 50# lat. Pull downs/ 30# tricep extension 20x each.  Standing scap. Retraction with 50#/ chest press 10x2 with feel staggered.  Seated 4# shoulder flexion/ bilateral ER/ bicep curls on blue ball with cuing to activate TrA 20x each.    Resisted gait at Nautilus (140#):  5x forward/ backwards with no UE assist.  110# lateral L/R 4x each.  Good heel and toe strike.  SBA for safety/cuing.     Hallway walking with high marching/ tandem  gait in //-bars (forward/backwards)/ braiding L/R 2 laps each.    Discussed [employer] ex. Program.  Pt. Will incorporate UE ex. To gym based ex. Prior to next tx. Session.     PATIENT EDUCATION:  Education details: Access Code: [email or web address] Person educated: Patient Education method: Explanation, Demonstration, and Verbal cues Education comprehension: verbalized understanding and returned demonstration   HOME EXERCISE PROGRAM: Access Code: [email or web address] URL: [email or web address] Date: 10/22/2021 Prepared by: Dorcas Carrow   Exercises - Supine Bridge  - 1 x daily - 7 x weekly - 1 sets - 20 reps - Supine March  - 1 x daily - 7 x weekly - 1 sets - 20 reps - Sit to Stand Without Arm Support  - 1 x daily - 7 x weekly - 2 sets - 10 reps - Standing Hip Flexion March  - 1 x daily - 7 x weekly - 1 sets - 20 reps  ASSESSMENT:  CLINICAL IMPRESSION: Pt. Reports no increase c/o low back pain during tx. Session.  Pt. Will continue to go to gym 2-3x/week and decrease PT tx. Frequency to 1x/week.  Moderate forward head/ rounded shoulder posture in seated/ standing position.  Pt. Progressing well with resisted/ strengthening ex. Program with no c/o low back pain.  Pt. Has 1 more PT tx. Session scheduled prior to end of cert/ Dr. Ellison Hughs f/u visit.  Pt. Will continue with current HEP.  Pt. Will benefit from skilled PT services to increase LE muscle strength to improve safety/ independence with walking.    OBJECTIVE IMPAIRMENTS Abnormal gait, decreased activity tolerance, decreased balance, decreased coordination, decreased endurance, decreased mobility, difficulty walking, decreased ROM, decreased strength, impaired flexibility, improper body mechanics, and pain.   ACTIVITY LIMITATIONS carrying, lifting, bending, standing, squatting, stairs, and transfers  PARTICIPATION LIMITATIONS: cleaning, community activity, and yard work  PERSONAL FACTORS Age, Fitness, and Past/current  experiences are also affecting patient's functional outcome.   REHAB POTENTIAL: Good  CLINICAL DECISION MAKING: Evolving/moderate complexity  EVALUATION COMPLEXITY: Moderate   GOALS: Goals reviewed with patient? Yes  SHORT TERM GOALS: Target date: 11/25/21 Pt. Will increase B hip flexion muscle strength 1/2 muscle grade to improve pain-free mobility  Baseline: see above Goal status: Partially met   LONG TERM GOALS: Target date: 12/17/21  Pt. Will increase FOTO to 67 to improve pain-free mobility.   Baseline: initial 53 Goal status: On-going  2.  Pt. Will decrease 5xSTS to <10 sec. To improve mobility/  decrease fall risk.  Baseline: see above Goal status: On-going  3.  Pt. Able to complete 30 minutes of there.ex. with no increase c/o pain to improve walking/ endurance/ mobility Baseline:  pt. Currently not participating with ex. Program.  8/28: pt. Has returned to [employer] 1x Goal status: Partially met  PLAN: PT FREQUENCY: 2x/week  PT DURATION: 8 weeks  PLANNED INTERVENTIONS: Therapeutic exercises, Therapeutic activity, Neuromuscular re-education, Balance training, Gait training, Patient/Family education, Self Care, Joint mobilization, and Manual therapy  PLAN FOR NEXT SESSION: Progress dynamic balance/ LE strength.   1 more tx. session  Pura Spice, PT, DPT # [phone] 12/07/2021, 10:56 AM

## 2021-12-12 ENCOUNTER — Encounter: Payer: Self-pay | Admitting: Physical Therapy

## 2021-12-12 ENCOUNTER — Ambulatory Visit: Payer: Medicare Other | Admitting: Physical Therapy

## 2021-12-12 DIAGNOSIS — G8929 Other chronic pain: Secondary | ICD-10-CM

## 2021-12-12 DIAGNOSIS — R269 Unspecified abnormalities of gait and mobility: Secondary | ICD-10-CM

## 2021-12-12 DIAGNOSIS — M6281 Muscle weakness (generalized): Secondary | ICD-10-CM | POA: Diagnosis not present

## 2021-12-12 DIAGNOSIS — M545 Low back pain, unspecified: Secondary | ICD-10-CM

## 2021-12-12 NOTE — Therapy (Signed)
OUTPATIENT PHYSICAL THERAPY LOWER EXTREMITY TREATMENT/ DISCHARGE   Patient Name: Zachary [name] MRN: [id number] DOB:06-10-1945, 76 y.o., male Today's Date: 12/12/2021   PT End of Session - 12/12/21 0952     Visit Number 12    Number of Visits 17    Date for PT Re-Evaluation 12/17/21    PT Start Time 0944    Activity Tolerance Patient tolerated treatment well    Behavior During Therapy [facility] for tasks assessed/performed             0944 to 1032  (48 minutes).   Past Medical History:  Diagnosis Date   Asthma    CHF (congestive heart failure) (HCC)    no CPAP   Chronic cough    COPD (chronic obstructive pulmonary disease) (HCC)    Coronary artery disease    Dyspnea    High cholesterol    Hypertension    Melanoma ([place])    Orthopnea    sleeps in a chair   Seasonal allergies    Stroke [facility])    Jan 2007   Past Surgical History:  Procedure Laterality Date   CARDIAC SURGERY     CABG x 5  2007   CATARACT EXTRACTION W/PHACO Left 07/07/2017   Procedure: CATARACT EXTRACTION PHACO AND INTRAOCULAR LENS PLACEMENT ([place]);  Surgeon: Leandrew Koyanagi, MD;  Location: [place];  Service: Ophthalmology;  Laterality: Left;  IVA TOPICAL LEFT   CATARACT EXTRACTION W/PHACO Right 07/28/2017   Procedure: CATARACT EXTRACTION PHACO AND INTRAOCULAR LENS PLACEMENT ([place]) RIGHT;  Surgeon: Leandrew Koyanagi, MD;  Location: [place];  Service: Ophthalmology;  Laterality: Right;   CORONARY ARTERY BYPASS GRAFT     x 5 in 2007   NASAL SINUS SURGERY     UVULECTOMY     Patient Active Problem List   Diagnosis Date Noted   COPD with acute bronchitis ([place]) 09/17/2021   SIRS (systemic inflammatory response syndrome) ([place]) 09/16/2021   Ludwig's angina 10/24/2016   Acute respiratory failure with hypoxia ([place]) 05/06/2015   Acute bronchitis 05/06/2015   Leukocytosis 05/06/2015   Hyperglycemia 05/06/2015   COPD exacerbation ([place]) 05/05/2015    PCP: Sofie Hartigan,  MD  REFERRING PROVIDER: Sofie Hartigan, MD  REFERRING DIAG: Generalized Weakness  THERAPY DIAG:  Muscle weakness (generalized)  Gait difficulty  Chronic left-sided low back pain without sciatica  Rationale for Evaluation and Treatment Rehabilitation  ONSET DATE: 01/30/2021  SUBJECTIVE:   SUBJECTIVE STATEMENT:  12/12/21 Pt. Has been going to [employer] 2x/week.  Pt. Brought gym based ex. Program for PT to review.  No c/o low back pain prior to tx. Session.     PERTINENT HISTORY: [name]., MD - 09/24/2021 1:00 PM EDT Formatting of this note is different from the original. Images from the original note were not included. Zachary [name] is a 76 y.o. male that comes today for the following problem(s):  Chief Complaint  Patient presents with  Hospital Follow Up  weakness   HPI: Patient in the office for follow-up of recent hospitalization for generalized weakness and COPD exacerbation. He was admitted on 09/16/2021 and discharged on 09/18/2021. He was placed on IV vancomycin, Flagyl and cefepime initially and then doxycycline and prednisone orally. He has completed the course of antibiotics and steroids as instructed. He notes his cough is significantly improved but still present. He notes the sputum was once again yellowish and this is almost cleared. He was previously treated with Levaquin after sputum culture  demonstrated Pseudomonas sensitive to quinolones earlier this year. He has followed with cardiology since discharge and a referral to pulmonology at his request was placed. He denies shortness of breath or wheezing currently. He notes his weakness is slightly improved but he still wishes to start physical therapy. He does not wish to do home therapy but wants to come to the office to do therapy and needs a referral placed. He notes prior to being taken to the hospital he fell out of his chair to the ground and was unable to get himself up. While in the  hospital he was noted to have leukocytosis and this improved but did not resolve prior to discharge. He continues to use his [place] and [employer] regularly.  Goals   Maintain health/healthy lifestyle   Patient Active Problem List  Diagnosis  Asthma without status asthmaticus  Allergic rhinitis  S/P coronary artery bypass graft x 5  Essential hypertension  Hyperlipidemia  CVA (cerebral vascular accident) (CMS-HCC)  Apnea, sleep  Chest pain with high risk for cardiac etiology  Impaired glucose tolerance  Seasonal allergic rhinitis due to pollen  COPD with exacerbation (CMS-HCC)  Acute bronchitis  COLD (chronic obstructive lung disease) (CMS-HCC)  Hyperglycemia  Elevated white blood cell count  CAD (coronary artery disease)  Hypertension  SOB (shortness of breath) on exertion  Aspiration of fluid causing abnormal reaction or later complication  Dysphagia, pharyngeal phase  Dysphagia, pharyngoesophageal phase   Past Medical History:  Diagnosis Date  Allergic state  Asthma without status asthmaticus  Bronchitis, chronic (CMS-HCC)  CAD (coronary artery disease)  Cataract cortical, senile  s/p surgical correction  Chronic sinusitis  with polyps  COPD (chronic obstructive pulmonary disease) (CMS-HCC) 2015  Elevated cholesterol  Hypertension  Melanoma (CMS-HCC)  malignant  Pneumonia 2023  Sleep apnea  requiring CPAP which the patient does not wear  Stroke (CMS-HCC) 04/12/2005  CVA, hemorrhagic - [facility]    PAIN:  Are you having pain? Yes: NPRS scale: 0-2/10 Pain location: L low back Pain description: achy/ stiff Aggravating factors: yardwork Relieving factors: rest  PRECAUTIONS: Fall  WEIGHT BEARING RESTRICTIONS No  FALLS:  Has patient fallen in last 6 months? No  LIVING ENVIRONMENT: Lives with: lives with their family Lives in: House/apartment  OCCUPATION: retired  PLOF: Independent  PATIENT GOALS Increase LE muscle strength/ improve  gait.     OBJECTIVE:   PATIENT SURVEYS:  FOTO initial 53/ goal 6  9/13: 52  COGNITION:  Overall cognitive status: Within functional limits for tasks assessed     SENSATION: WFL  MUSCLE LENGTH: Hamstrings: Right 50 deg; Left 50 deg  (moderate hamstring/ gastroc tightness)  POSTURE: rounded shoulders, forward head, and flexed trunk   LOWER EXTREMITY ROM:  B shoulder AROM WFL.  B UE strength grossly 5/5 MMT except L/R shoulder flexion 4/5 MMT.   B LE AROM WFL.    LOWER EXTREMITY MMT:  MMT Right eval Left eval  Hip flexion 4/5 4/5  Hip extension    Hip abduction 4+/5  4+/5  Hip adduction 4+/5  4+/5  Hip internal rotation    Hip external rotation    Knee flexion 5/5 5/5  Knee extension 4+/5 4+/5  Ankle dorsiflexion 5/5 5/5  Ankle plantarflexion    Ankle inversion    Ankle eversion     (Blank rows = not tested)  FUNCTIONAL TESTS:  5 times sit to stand: 12.31 sec./ 11.56 sec.  GAIT: Distance walked: in clinic Assistive device utilized: None  Level of assistance: Complete Independence Comments: Decrease L hip flexion/ step pattern/ heel strike noted.  No LOB during eval.     TODAY'S TREATMENT:  12/12/21:  Nustep L6 10 min. B UE/LE.  Reviewed gym based ex.    Walking in hallway with 6"/12" hurdles 3 laps.    Nautilus: standing 50# lat. Pull downs/ 40# tricep extension 20x each.  Standing scap. Retraction with 50#/ chest press 10x2 with feel staggered.  Seated 4# shoulder flexion/ bilateral ER/ bicep curls on blue ball with cuing to activate TrA 20x each.    Resisted gait at Nautilus (140#):  5x forward/ backwards with no UE assist.  110# lateral L/R 4x each.  Good heel and toe strike.  SBA for safety/cuing.      Reassessment of goals:    Discussed [employer] ex. Program.  Pt. Will incorporate UE ex. To gym based ex. (Exercise handout scanned into chart).     PATIENT EDUCATION:  Education details: Access Code: [email or web address] Person educated:  Patient Education method: Explanation, Demonstration, and Verbal cues Education comprehension: verbalized understanding and returned demonstration   HOME EXERCISE PROGRAM: Access Code: [email or web address] URL: [email or web address] Date: 10/22/2021 Prepared by: Dorcas Carrow   Exercises - Supine Bridge  - 1 x daily - 7 x weekly - 1 sets - 20 reps - Supine March  - 1 x daily - 7 x weekly - 1 sets - 20 reps - Sit to Stand Without Arm Support  - 1 x daily - 7 x weekly - 2 sets - 10 reps - Standing Hip Flexion March  - 1 x daily - 7 x weekly - 1 sets - 20 reps  ASSESSMENT:  CLINICAL IMPRESSION: Pt. Has worked hard with skilled PT services and has made good progress towards goals.  Pt. Will continue to go to gym 2-3x/week and decrease PT tx. Frequency to 1x/week.  Pt. Understands current gym based/ walking program and instructed to contact PT if any regression in symptoms or questions.  Discharge from PT at this time.     OBJECTIVE IMPAIRMENTS Abnormal gait, decreased activity tolerance, decreased balance, decreased coordination, decreased endurance, decreased mobility, difficulty walking, decreased ROM, decreased strength, impaired flexibility, improper body mechanics, and pain.   ACTIVITY LIMITATIONS carrying, lifting, bending, standing, squatting, stairs, and transfers  PARTICIPATION LIMITATIONS: cleaning, community activity, and yard work  PERSONAL FACTORS Age, Fitness, and Past/current experiences are also affecting patient's functional outcome.   REHAB POTENTIAL: Good  CLINICAL DECISION MAKING: Evolving/moderate complexity  EVALUATION COMPLEXITY: Moderate   GOALS: Goals reviewed with patient? Yes  SHORT TERM GOALS: Target date: 11/25/21 Pt. Will increase B hip flexion muscle strength 1/2 muscle grade to improve pain-free mobility  Baseline: see above.  9/13:  B hip flexion 4+/5 MMT.   Goal status: Goal met   LONG TERM GOALS: Target date: 12/17/21  Pt. Will increase  FOTO to 67 to improve pain-free mobility.   Baseline: initial 53.  9/13: 86 Goal status: Goal met  2.  Pt. Will decrease 5xSTS to <10 sec. To improve mobility/ decrease fall risk.  Baseline: see above.  9/13: 8.72 sec Goal status: Goal met  3.  Pt. Able to complete 30 minutes of there.ex. with no increase c/o pain to improve walking/ endurance/ mobility Baseline:  pt. Currently not participating with ex. Program.  8/28: pt. Has returned to [employer] 1x Goal status: Goal met  PLAN: PT FREQUENCY: 2x/week  PT DURATION: 8 weeks  PLANNED INTERVENTIONS: Therapeutic exercises, Therapeutic activity, Neuromuscular  re-education, Balance training, Gait training, Patient/Family education, Self Care, Joint mobilization, and Manual therapy  PLAN FOR NEXT SESSION:   Discharge visit   Pura Spice, PT, DPT # [phone] 12/12/2021, 9:52 AM

## 2022-01-02 ENCOUNTER — Other Ambulatory Visit (HOSPITAL_COMMUNITY): Payer: Self-pay | Admitting: Family Medicine

## 2022-01-02 ENCOUNTER — Other Ambulatory Visit: Payer: Self-pay | Admitting: Family Medicine

## 2022-01-02 DIAGNOSIS — R221 Localized swelling, mass and lump, neck: Secondary | ICD-10-CM

## 2022-01-04 ENCOUNTER — Ambulatory Visit
Admission: RE | Admit: 2022-01-04 | Discharge: 2022-01-04 | Disposition: A | Discharge status: Home / Self Care | Payer: Medicare Other | Source: Ambulatory Visit | Attending: Family Medicine | Admitting: Family Medicine

## 2022-01-04 DIAGNOSIS — R221 Localized swelling, mass and lump, neck: Secondary | ICD-10-CM | POA: Insufficient documentation

## 2023-05-04 ENCOUNTER — Ambulatory Visit
Admission: EM | Admit: 2023-05-04 | Discharge: 2023-05-04 | Disposition: A | Discharge status: Home / Self Care | Payer: Medicare Other | Attending: Emergency Medicine | Admitting: Emergency Medicine

## 2023-05-04 ENCOUNTER — Ambulatory Visit (INDEPENDENT_AMBULATORY_CARE_PROVIDER_SITE_OTHER): Payer: Medicare Other

## 2023-05-04 DIAGNOSIS — S52592A Other fractures of lower end of left radius, initial encounter for closed fracture: Secondary | ICD-10-CM

## 2023-05-04 DIAGNOSIS — S6992XA Unspecified injury of left wrist, hand and finger(s), initial encounter: Secondary | ICD-10-CM

## 2023-05-04 NOTE — ED Triage Notes (Signed)
Pt states that he was working in his yard and was on his knees putting in guard rails for his steps. He was using a hammer as a cane to assist himself to stand when it slipped and he fell forward. Pt caught himself with his left hand and felt immediate pain.  Pt states that he has pain in his wrist.  Pt denies pain in the hand, elbow, or forearm

## 2023-05-04 NOTE — ED Provider Notes (Signed)
HPI  SUBJECTIVE:  Zachary [name] is a right-handed 78 y.o. male who presents with left wrist pain, swelling, deformity and limited motion after accidentally falling on outstretched hand about 30 minutes prior to arrival.  He describes the pain as constant achiness and sharp with movement.  He denies injury to the elbow, forearm or hand.  No numbness or tingling in the fingers.  He tried Tylenol and ice with improvement in his symptoms.  Symptoms are worse with wrist flexion.  He has a past medical history of CVA, CHF, COPD, CABG x 5, on aspirin daily, coronary artery disease.  PCP: [facility].  Orthopedics: None.  He is a Psychologist, clinical.   Past Medical History:  Diagnosis Date   Asthma    CHF (congestive heart failure) (HCC)    no CPAP   Chronic cough    COPD (chronic obstructive pulmonary disease) (HCC)    Coronary artery disease    Dyspnea    High cholesterol    Hypertension    Melanoma (HCC)    Orthopnea    sleeps in a chair   Seasonal allergies    Stroke [facility])    Jan 2007    Past Surgical History:  Procedure Laterality Date   CARDIAC SURGERY     CABG x 5  2007   CATARACT EXTRACTION W/PHACO Left 07/07/2017   Procedure: CATARACT EXTRACTION PHACO AND INTRAOCULAR LENS PLACEMENT (IOC);  Surgeon: Lockie Mola, MD;  Location: [facility];  Service: Ophthalmology;  Laterality: Left;  IVA TOPICAL LEFT   CATARACT EXTRACTION W/PHACO Right 07/28/2017   Procedure: CATARACT EXTRACTION PHACO AND INTRAOCULAR LENS PLACEMENT (IOC) RIGHT;  Surgeon: Lockie Mola, MD;  Location: [facility];  Service: Ophthalmology;  Laterality: Right;   CORONARY ARTERY BYPASS GRAFT     x 5 in 2007   NASAL SINUS SURGERY     UVULECTOMY      Family History  Problem Relation Age of Onset   Pancreatic cancer Mother    Hypertension Father    Hypertension Sister     Social History   Tobacco Use   Smoking status: Former   Smokeless tobacco: Never  Vaping Use   Vaping  status: Never Used  Substance Use Topics   Alcohol use: No   Drug use: No    No current facility-administered medications for this encounter.  Current Outpatient Medications:    albuterol (PROVENTIL) (2.5 MG/3ML) 0.083% nebulizer solution, Take 3 mLs (2.5 mg total) by nebulization every 4 (four) hours as needed for wheezing or shortness of breath., Disp: 360 vial, Rfl: 12   aspirin EC 81 MG tablet, Take 81 mg by mouth daily., Disp: , Rfl:    Dupilumab 300 MG/2ML SOAJ, Inject into the skin., Disp: , Rfl:    fluticasone (FLONASE) 50 MCG/ACT nasal spray, Place 1 spray into both nostrils daily., Disp: 16 g, Rfl: 2   guaiFENesin (MUCINEX) 600 MG 12 hr tablet, Take 1 tablet (600 mg total) by mouth 2 (two) times daily., Disp: 28 tablet, Rfl: 0   isosorbide mononitrate (IMDUR) 30 MG 24 hr tablet, Take 30 mg by mouth at bedtime. , Disp: , Rfl:    metoprolol (LOPRESSOR) 50 MG tablet, Take 50 mg by mouth 2 (two) times daily., Disp: , Rfl:    rosuvastatin (CRESTOR) 5 MG tablet, Take 2.5 mg by mouth at bedtime., Disp: , Rfl:    TRELEGY ELLIPTA 100-62.5-25 MCG/ACT AEPB, Inhale into the lungs., Disp: , Rfl:    budesonide-formoterol (  SYMBICORT) 160-4.5 MCG/ACT inhaler, Inhale 2 puffs into the lungs 2 (two) times daily. (Patient not taking: Reported on 09/17/2021), Disp: 1 Inhaler, Rfl: 12   doxycycline (VIBRA-TABS) 100 MG tablet, Take 1 tablet (100 mg total) by mouth 2 (two) times daily., Disp: 10 tablet, Rfl: 0   DULERA 200-5 MCG/ACT AERO, Inhale 2 puffs into the lungs 2 (two) times daily., Disp: , Rfl:    predniSONE (DELTASONE) 10 MG tablet, Takes 6 tablets for 1 days, then 5 tablets for 1 days, then 4 tablets for 1 days, then 3 tablets for 1 days, then 2 tabs for 1 days, then 1 tab for 1 days, and then stop., Disp: 21 tablet, Rfl: 0   tiotropium (SPIRIVA) 18 MCG inhalation capsule, Place 1 capsule (18 mcg total) into inhaler and inhale daily. (Patient not taking: Reported on 09/17/2021), Disp: 30 capsule,  Rfl: 12  Allergies  Allergen Reactions   Peanuts [Peanut Oil] Anaphylaxis   Atorvastatin Other (See Comments)    Pt states that he had a stroke while taking this medication and was advised by his neurologist not to take it.   Other reaction(s): Unknown   Sulfa Antibiotics Hives and Other (See Comments)   Fluticasone-Salmeterol Rash    Other reaction(s): Other (See Comments)     ROS  As noted in HPI.   Physical Exam  BP (!) 145/81 (BP Location: Left Arm)   Pulse 74   Temp 98.7 F (37.1 C) (Oral)   Ht 5\' 10"  (1.778 m)   Wt 88.5 kg   SpO2 94%   BMI 27.98 kg/m   Constitutional: Well developed, well nourished, no acute distress Eyes:  EOMI, conjunctiva normal bilaterally HENT: Normocephalic, atraumatic,mucus membranes moist Respiratory: Normal inspiratory effort Cardiovascular: Normal rate GI: nondistended skin: No rash, skin intact Musculoskeletal: L  distal radius tender, swollen.  Distal ulnar styloid NT , snuffbox NT, carpals NT, metacarpals NT, digits NT , TFCC NT.  Pain with flexion.  Patient able to perform radial/ulnar deviation, flexion/extension.   Motor intact in the median/radial/ulnar distribution, Sensation LT to hand normal. Rp 2+.  No bruising, erythema, edema.  Cap refill in all fingers less than 2 seconds.  Elbow and proximal forearm NT.  Neurologic: Alert & oriented x 3, no focal neuro deficits Psychiatric: Speech and behavior appropriate   ED Course   Medications - No data to display  Orders Placed This Encounter  Procedures   DG Wrist Complete Left    Standing Status:   Standing    Number of Occurrences:   1    Reason for Exam (SYMPTOM  OR DIAGNOSIS REQUIRED):   FOOSH earlier today, positive distal wrist swelling rule out fracture   Apply other splint    Standing Status:   Standing    Number of Occurrences:   1    Laterality:   Left    Splint type:   Sugar-tong    No results found for this or any previous visit (from the past 24 hours). DG  Wrist Complete Left Result Date: 05/04/2023 CLINICAL DATA:  Fall earlier today.  Swelling. EXAM: LEFT WRIST - COMPLETE 3+ VIEW COMPARISON:  None Available. FINDINGS: Impacted, mildly dorsally angulated distal radius fracture without intra-articular extension. Soft tissue swelling, including volarly. Surgical clips about the volar and radial side of the wrist. IMPRESSION: Impacted distal radius fracture. Electronically Signed   By: Jeronimo Greaves M.D.   On: 05/04/2023 15:44    ED Clinical Impression  1. Other closed fracture of  distal end of left radius, initial encounter   2. Injury of left wrist, initial encounter      ED Assessment/Plan      Reviewed imaging independently.  Impacted distal radius fracture with mild angulation dorsally.  Soft tissue swelling.  See radiology report for full details.   Patient presents with a impacted mildly dorsally angulated distal radius fracture without intra-articular extension.  Placing in a sugar-tong splint.  Advised close follow-up with [facility].  Home with Tylenol/ibuprofen.  Patient states he does not need  prescription for ibuprofen.  He declined a prescription for Norco.  He had severe constipation that required ER evaluation last time he took it.  Showed patient and spouse the x-rays, discussed imaging, MDM, treatment plan, and plan for follow-up with patient. Discussed sn/sx that should prompt return to the ED. patient agrees with plan.   No orders of the defined types were placed in this encounter.     *This clinic note was created using Dragon dictation software. Therefore, there may be occasional mistakes despite careful proofreading.  ?    Domenick Gong, MD 05/05/23 [phone]

## 2023-05-04 NOTE — Discharge Instructions (Signed)
You have a impacted, mildly dorsally angulated distal radius fracture without intra-articular extension.  You also have soft tissue swelling.  We have placed you in a sugar-tong splint.  Take 600 mg of ibuprofen combined with 1000 mg of Tylenol 3 times a day.  Follow-up with [facility] ASAP.
